# Patient Record
Sex: Female | Born: 1995 | Hispanic: No | Marital: Single | Smoking: Never smoker
Health system: Southern US, Community
[De-identification: ages and names within clinical notes are randomized; demographics above are authoritative.]

## PROBLEM LIST (undated history)

## (undated) DIAGNOSIS — J45909 Unspecified asthma, uncomplicated: Secondary | ICD-10-CM

---

## 2016-08-24 ENCOUNTER — Encounter (HOSPITAL_COMMUNITY): Payer: Self-pay | Admitting: Emergency Medicine

## 2016-08-24 ENCOUNTER — Ambulatory Visit (HOSPITAL_COMMUNITY)
Admission: EM | Admit: 2016-08-24 | Discharge: 2016-08-24 | Disposition: A | Discharge status: Home / Self Care | Payer: Self-pay | Attending: Family Medicine | Admitting: Family Medicine

## 2016-08-24 DIAGNOSIS — R0982 Postnasal drip: Secondary | ICD-10-CM

## 2016-08-24 DIAGNOSIS — J069 Acute upper respiratory infection, unspecified: Secondary | ICD-10-CM

## 2016-08-24 NOTE — ED Provider Notes (Signed)
CSN: [id number]     Arrival date & time 08/24/16  1559 History   First MD Initiated Contact with Patient 08/24/16 1615     Chief Complaint  Patient presents with  . URI   (Consider location/radiation/quality/duration/timing/severity/associated sxs/prior Treatment) 21 year old female who works at [employer] presents with a 2-3 day history of cough, sneezing, coughing up phlegm in her throat, pain in the throat with coughing and stuffy nose. Denies fever or chills. She has been taking Robitussin and using her albuterol inhaler for sneezing. She is currently afebrile and vital signs are normal. She is smiling, laughing and showing no signs of distress.      History reviewed. No pertinent past medical history. History reviewed. No pertinent surgical history. No family history on file. Social History  Substance Use Topics  . Smoking status: Never Smoker  . Smokeless tobacco: Never Used  . Alcohol use No   OB History    No data available     Review of Systems  Constitutional: Positive for activity change and fatigue. Negative for appetite change, chills and fever.  HENT: Positive for congestion, postnasal drip and rhinorrhea. Negative for facial swelling.   Eyes: Negative.   Respiratory: Positive for cough. Negative for shortness of breath.   Cardiovascular: Negative.   Musculoskeletal: Negative for neck pain and neck stiffness.  Skin: Negative for pallor and rash.  Neurological: Negative.   All other systems reviewed and are negative.   Allergies  Patient has no known allergies.  Home Medications   Prior to Admission medications   Medication Sig Start Date End Date Taking? Authorizing Provider  albuterol (PROVENTIL HFA;VENTOLIN HFA) 108 (90 Base) MCG/ACT inhaler Inhale into the lungs every 6 (six) hours as needed for wheezing or shortness of breath.   Yes Historical Provider, MD   Meds Ordered and Administered this Visit  Medications - No data to display  BP 129/91 (BP  Location: Left Arm)   Pulse 101   Temp 98.4 F (36.9 C) (Oral)   Resp 18   LMP 07/29/2016   SpO2 98%  No data found.   Physical Exam  Constitutional: She is oriented to person, place, and time. She appears well-developed and well-nourished. No distress.  HENT:  Head: Normocephalic and atraumatic.  Right Ear: External ear normal.  Left Ear: External ear normal.  Bilateral TMs are normal Oropharynx with minor erythema and clear PND. No exudates or swelling.  Eyes: EOM are normal. Pupils are equal, round, and reactive to light.  Neck: Normal range of motion. Neck supple.  Cardiovascular: Normal rate, regular rhythm, normal heart sounds and intact distal pulses.   Pulmonary/Chest: Effort normal and breath sounds normal. No respiratory distress. She has no wheezes. She has no rales.  Musculoskeletal: Normal range of motion. She exhibits no edema.  Neurological: She is alert and oriented to person, place, and time.  Skin: Skin is warm and dry.  Nursing note and vitals reviewed.   Urgent Care Course     Procedures (including critical care time)  Labs Review Labs Reviewed - No data to display  Imaging Review No results found.   Visual Acuity Review  Right Eye Distance:   Left Eye Distance:   Bilateral Distance:    Right Eye Near:   Left Eye Near:    Bilateral Near:         MDM   1. Acute upper respiratory infection   2. PND (post-nasal drip)    Sudafed PE 10 mg every 4 to  6 hours as needed for congestion Allegra or Zyrtec daily as needed for drainage and runny nose. For stronger antihistamine may take Chlor-Trimeton 2 to 4 mg every 4 to 6 hours, may cause drowsiness. Saline nasal spray used frequently. Ibuprofen 600 mg every 6 hours as needed for pain, discomfort or fever. Drink plenty of fluids and stay well-hydrated.     Hayden Rasmussen, NP 08/24/16 [phone]

## 2016-08-24 NOTE — ED Triage Notes (Signed)
Pt c/o cold sx onset: 2 days  Sx include: prod cough, sneezing, ST  Denies: fevers  Taking: OTC cold meds w/temp relief.   A&O x4... NAD

## 2016-08-24 NOTE — Discharge Instructions (Signed)
Sudafed PE 10 mg every 4 to 6 hours as needed for congestion °Allegra or Zyrtec daily as needed for drainage and runny nose. °For stronger antihistamine may take Chlor-Trimeton 2 to 4 mg every 4 to 6 hours, may cause drowsiness. °Saline nasal spray used frequently. °Ibuprofen 600 mg every 6 hours as needed for pain, discomfort or fever. °Drink plenty of fluids and stay well-hydrated. °

## 2016-10-20 ENCOUNTER — Encounter (HOSPITAL_COMMUNITY): Payer: Self-pay | Admitting: Emergency Medicine

## 2016-10-20 ENCOUNTER — Ambulatory Visit (HOSPITAL_COMMUNITY)
Admission: EM | Admit: 2016-10-20 | Discharge: 2016-10-20 | Disposition: A | Discharge status: Home / Self Care | Payer: Self-pay | Attending: Internal Medicine | Admitting: Internal Medicine

## 2016-10-20 DIAGNOSIS — B9789 Other viral agents as the cause of diseases classified elsewhere: Secondary | ICD-10-CM

## 2016-10-20 DIAGNOSIS — J069 Acute upper respiratory infection, unspecified: Secondary | ICD-10-CM

## 2016-10-20 HISTORY — DX: Unspecified asthma, uncomplicated: J45.909

## 2016-10-20 MED ORDER — IPRATROPIUM BROMIDE 0.06 % NA SOLN: 2.0000 | Freq: Four times a day (QID) | NASAL | 0 refills | Status: DC

## 2016-10-20 MED ORDER — BENZONATATE 100 MG PO CAPS: 100.0000 mg | ORAL_CAPSULE | Freq: Three times a day (TID) | ORAL | 0 refills | Status: DC

## 2016-10-20 NOTE — ED Provider Notes (Signed)
CSN: [id number]     Arrival date & time 10/20/16  1816 History   First MD Initiated Contact with Patient 10/20/16 1958     Chief Complaint  Patient presents with  . URI   (Consider location/radiation/quality/duration/timing/severity/associated sxs/prior Treatment) Patient c/o uri sx's for 2 days.   The history is provided by the patient.  URI  Presenting symptoms: congestion, cough and fatigue   Severity:  Mild Onset quality:  Sudden Duration:  2 days Timing:  Constant Progression:  Worsening Chronicity:  New Relieved by:  None tried Worsened by:  Nothing Ineffective treatments:  None tried   Past Medical History:  Diagnosis Date  . Asthma    History reviewed. No pertinent surgical history. No family history on file. Social History  Substance Use Topics  . Smoking status: Never Smoker  . Smokeless tobacco: Never Used  . Alcohol use No   OB History    No data available     Review of Systems  Constitutional: Positive for fatigue.  HENT: Positive for congestion.   Eyes: Negative.   Respiratory: Positive for cough.   Cardiovascular: Negative.   Gastrointestinal: Negative.   Endocrine: Negative.   Genitourinary: Negative.   Musculoskeletal: Negative.   Allergic/Immunologic: Negative.   Neurological: Negative.   Hematological: Negative.   Psychiatric/Behavioral: Negative.     Allergies  Patient has no known allergies.  Home Medications   Prior to Admission medications   Medication Sig Start Date End Date Taking? Authorizing Provider  albuterol (PROVENTIL HFA;VENTOLIN HFA) 108 (90 Base) MCG/ACT inhaler Inhale into the lungs every 6 (six) hours as needed for wheezing or shortness of breath.   Yes Historical Provider, MD  cetirizine (ZYRTEC) 10 MG tablet Take 10 mg by mouth daily.   Yes Historical Provider, MD  benzonatate (TESSALON) 100 MG capsule Take 1 capsule (100 mg total) by mouth every 8 (eight) hours. 10/20/16   Deatra Canter, FNP  ipratropium  (ATROVENT) 0.06 % nasal spray Place 2 sprays into both nostrils 4 (four) times daily. 10/20/16   Deatra Canter, FNP   Meds Ordered and Administered this Visit  Medications - No data to display  BP 128/83   Pulse (!) 101   Temp 98.5 F (36.9 C) (Oral)   Resp 16   Ht 5\' 4"  (1.626 m)   LMP 09/29/2016   SpO2 100%  No data found.   Physical Exam  Constitutional: She appears well-developed and well-nourished.  HENT:  Head: Normocephalic and atraumatic.  Right Ear: External ear normal.  Left Ear: External ear normal.  Mouth/Throat: Oropharynx is clear and moist.  Eyes: Conjunctivae and EOM are normal. Pupils are equal, round, and reactive to light.  Neck: Normal range of motion. Neck supple.  Cardiovascular: Normal rate, regular rhythm and normal heart sounds.   Pulmonary/Chest: Effort normal and breath sounds normal.  Abdominal: Soft. Bowel sounds are normal.  Nursing note and vitals reviewed.   Urgent Care Course     Procedures (including critical care time)  Labs Review Labs Reviewed - No data to display  Imaging Review No results found.   Visual Acuity Review  Right Eye Distance:   Left Eye Distance:   Bilateral Distance:    Right Eye Near:   Left Eye Near:    Bilateral Near:         MDM   1. Viral URI with cough    Atrovent nasal spray Tessalon Perles  Push po fluids, rest, tylenol and motrin otc prn  as directed for fever, arthralgias, and myalgias.  Follow up prn if sx's continue or persist.   Deatra CanterWilliam J Eligh Rybacki, FNP 10/20/16 2000

## 2016-10-20 NOTE — ED Triage Notes (Signed)
PT reports productive cough and congestion. PT denies fevers. His. Of asthma

## 2017-08-29 ENCOUNTER — Other Ambulatory Visit: Payer: Self-pay

## 2017-08-29 ENCOUNTER — Ambulatory Visit (HOSPITAL_COMMUNITY)
Admission: EM | Admit: 2017-08-29 | Discharge: 2017-08-29 | Disposition: A | Discharge status: Home / Self Care | Payer: Self-pay | Attending: Family Medicine | Admitting: Family Medicine

## 2017-08-29 ENCOUNTER — Encounter (HOSPITAL_COMMUNITY): Payer: Self-pay | Admitting: Emergency Medicine

## 2017-08-29 DIAGNOSIS — J111 Influenza due to unidentified influenza virus with other respiratory manifestations: Secondary | ICD-10-CM

## 2017-08-29 DIAGNOSIS — R062 Wheezing: Secondary | ICD-10-CM

## 2017-08-29 DIAGNOSIS — R69 Illness, unspecified: Principal | ICD-10-CM

## 2017-08-29 MED ORDER — PREDNISONE 10 MG (21) PO TBPK: ORAL_TABLET | Freq: Every day | ORAL | 0 refills | Status: DC

## 2017-08-29 NOTE — Discharge Instructions (Signed)

## 2017-08-29 NOTE — ED Triage Notes (Signed)
Pt reports productive cough, nasal congestion and drainage, sore throat and SOB since yesterday.

## 2017-09-03 ENCOUNTER — Other Ambulatory Visit: Payer: Self-pay

## 2017-09-03 ENCOUNTER — Ambulatory Visit (HOSPITAL_COMMUNITY)
Admission: EM | Admit: 2017-09-03 | Discharge: 2017-09-03 | Disposition: A | Discharge status: Home / Self Care | Payer: Self-pay | Attending: Family Medicine | Admitting: Family Medicine

## 2017-09-03 ENCOUNTER — Encounter (HOSPITAL_COMMUNITY): Payer: Self-pay | Admitting: Emergency Medicine

## 2017-09-03 DIAGNOSIS — J22 Unspecified acute lower respiratory infection: Secondary | ICD-10-CM

## 2017-09-03 MED ORDER — AZITHROMYCIN 250 MG PO TABS: ORAL_TABLET | ORAL | 0 refills | Status: DC

## 2017-09-03 NOTE — ED Triage Notes (Signed)
Seen 1/30.  Patient is coughing up more phlegm and increased drainage.  Patient is very certain she has been following instructions from 1/30 visit, cough will not go away

## 2017-09-03 NOTE — ED Provider Notes (Signed)
[facility]    CSN: [id number] Arrival date & time: 09/03/17  1007     History   Chief Complaint Chief Complaint  Patient presents with  . Cough    HPI Laurie Obrien is a 22 y.o. female.   Laurie Obrien presents with complaints of worsening cough. Symptoms started 1/29 with productive cough and wheezing, runny nose. She was seen on 1/30 and provided course of prednisone. States she had mildly improved but then over the past two days her cough has worsened. Productive of mucus. No known fevers. Still with runny nose. No sore throat or ear pain. Denies gi/gu complaints. Denies shortness of breath, chest pain. History of asthma, has used her inhaler which helps. Has not been taking zyrtec as she usually does. Denies wheezing.    ROS per HPI.       Past Medical History:  Diagnosis Date  . Asthma     There are no active problems to display for this patient.   History reviewed. No pertinent surgical history.  OB History    No data available       Home Medications    Prior to Admission medications   Medication Sig Start Date End Date Taking? Authorizing Provider  cetirizine (ZYRTEC) 10 MG tablet Take 10 mg by mouth daily.   Yes [provider]  predniSONE (STERAPRED UNI-PAK 21 TAB) 10 MG (21) TBPK tablet Take by mouth daily. Take as directed. 08/29/17  Yes Hagler, Arlys JohnBrian, MD  albuterol (PROVENTIL HFA;VENTOLIN HFA) 108 (90 Base) MCG/ACT inhaler Inhale into the lungs every 6 (six) hours as needed for wheezing or shortness of breath.    [provider]  azithromycin (ZITHROMAX) 250 MG tablet Take first 2 tablets together, then 1 every day until finished. 09/03/17   Georgetta HaberBurky, Taheerah Guldin B, NP    Family History Family History  Problem Relation Age of Onset  . Hypertension Maternal Grandmother     Social History Social History   Tobacco Use  . Smoking status: Never Smoker  . Smokeless tobacco: Never Used  Substance Use Topics  . Alcohol use: No  .  Drug use: No     Allergies   Patient has no known allergies.   Review of Systems Review of Systems   Physical Exam Triage Vital Signs ED Triage Vitals [09/03/17 1022]  Enc Vitals Group     BP 137/78     Pulse Rate (!) 105     Resp 18     Temp 98.4 F (36.9 C)     Temp Source Oral     SpO2 99 %     Weight      Height      Head Circumference      Peak Flow      Pain Score      Pain Loc      Pain Edu?      Excl. in GC?    No data found.  Updated Vital Signs BP 137/78 (BP Location: Right Arm)   Pulse (!) 105 Comment: Notified Tina G  Temp 98.4 F (36.9 C) (Oral)   Resp 18   LMP 08/22/2017 (Approximate)   SpO2 99%   Visual Acuity Right Eye Distance:   Left Eye Distance:   Bilateral Distance:    Right Eye Near:   Left Eye Near:    Bilateral Near:     Physical Exam  Constitutional: She is oriented to person, place, and time. She appears well-developed and well-nourished. No distress.  HENT:  Head: Normocephalic and atraumatic.  Right Ear: Tympanic membrane, external ear and ear canal normal.  Left Ear: Tympanic membrane, external ear and ear canal normal.  Nose: Rhinorrhea present. Right sinus exhibits no maxillary sinus tenderness and no frontal sinus tenderness. Left sinus exhibits no maxillary sinus tenderness and no frontal sinus tenderness.  Mouth/Throat: Uvula is midline, oropharynx is clear and moist and mucous membranes are normal. No tonsillar exudate.  Eyes: Conjunctivae and EOM are normal. Pupils are equal, round, and reactive to light.  Cardiovascular: Regular rhythm and normal heart sounds. Tachycardia present.  Pulmonary/Chest: Effort normal and breath sounds normal.  Neurological: She is alert and oriented to person, place, and time.  Skin: Skin is warm and dry.     UC Treatments / Results  Labs (all labs ordered are listed, but only abnormal results are displayed) Labs Reviewed - No data to display  EKG  EKG Interpretation None         Radiology No results found.  Procedures Procedures (including critical care time)  Medications Ordered in UC Medications - No data to display   Initial Impression / Assessment and Plan / UC Course  I have reviewed the triage vital signs and the nursing notes.  Pertinent labs & imaging results that were available during my care of the patient were reviewed by me and considered in my medical decision making (see chart for details).     Worsening symptoms over the past week, on prednisone. Noted tachycardia. History of asthma. zpack initiated, deferred imaging at this time. Continue with supportive cares, complete course of prednisone. Return precautions provided. Patient verbalized understanding and agreeable to plan.    Final Clinical Impressions(s) / UC Diagnoses   Final diagnoses:  Lower respiratory infection    ED Discharge Orders        Ordered    azithromycin (ZITHROMAX) 250 MG tablet     09/03/17 1038       Controlled Substance Prescriptions [place] Controlled Substance Registry consulted? Not Applicable   Georgetta Haber, NP 09/03/17 1043

## 2017-09-03 NOTE — Discharge Instructions (Signed)
Push fluids to ensure adequate hydration and keep secretions thin.  °Tylenol and/or ibuprofen as needed for pain or fevers.  °Complete course of antibiotics.  °If symptoms worsen or do not improve in the next week to return to be seen or to follow up with your PCP.   °

## 2017-09-03 NOTE — ED Provider Notes (Signed)
  [facility]   [id number] 08/29/17 Arrival Time: 1825  ASSESSMENT & PLAN:  1. Influenza-like illness   2. Wheezing     Meds ordered this encounter  Medications  . predniSONE (STERAPRED UNI-PAK 21 TAB) 10 MG (21) TBPK tablet    Sig: Take by mouth daily. Take as directed.    Dispense:  21 tablet    Refill:  0   Discussed typical duration of symptoms. OTC symptom care as needed. Ensure adequate fluid intake and rest. May f/u with PCP or here as needed.  Reviewed expectations re: course of current medical issues. Questions answered. Outlined signs and symptoms indicating need for more acute intervention. Patient verbalized understanding. After Visit Summary given.   SUBJECTIVE: History from: patient.  Laurie Obrien is a 22 y.o. female who presents with complaint of nasal congestion, post-nasal drainage, and a persistent dry cough. Onset abrupt, approximately 1 day ago. Overall fatigued with body aches. SOB: none. Wheezing: questions mild at times, esp with coughing. Fever: yes, subjective. Overall normal PO intake without n/v. Sick contacts: no. OTC treatment: None.  Received flu shot this year: no.  Social History   Tobacco Use  Smoking Status Never Smoker  Smokeless Tobacco Never Used    ROS: As per HPI.   OBJECTIVE:  Vitals:   08/29/17 1846  BP: 113/78  Pulse: (!) 108  Temp: 99 F (37.2 C)  TempSrc: Oral  SpO2: 99%     General appearance: alert; appears fatigued HEENT: nasal congestion; clear runny nose; throat irritation secondary to post-nasal drainage Neck: supple without LAD Lungs: unlabored respirations, symmetrical air entry with mild bilateral exp wheezes; cough: mild; no respiratory distress Skin: warm and dry Psychological: alert and cooperative; normal mood and affect  No Known Allergies  Past Medical History:  Diagnosis Date  . Asthma    History reviewed. No pertinent family history. Social History   Socioeconomic History   . Marital status: Single    Spouse name: Not on file  . Number of children: Not on file  . Years of education: Not on file  . Highest education level: Not on file  Social Needs  . Financial resource strain: Not on file  . Food insecurity - worry: Not on file  . Food insecurity - inability: Not on file  . Transportation needs - medical: Not on file  . Transportation needs - non-medical: Not on file  Occupational History  . Not on file  Tobacco Use  . Smoking status: Never Smoker  . Smokeless tobacco: Never Used  Substance and Sexual Activity  . Alcohol use: No  . Drug use: No  . Sexual activity: No  Other Topics Concern  . Not on file  Social History Narrative  . Not on file           Mardella LaymanHagler, Brylee Mcgreal, MD 09/03/17 [phone]

## 2018-10-07 ENCOUNTER — Ambulatory Visit (HOSPITAL_COMMUNITY)
Admission: EM | Admit: 2018-10-07 | Discharge: 2018-10-07 | Disposition: A | Discharge status: Home / Self Care | Payer: Self-pay | Attending: Emergency Medicine | Admitting: Emergency Medicine

## 2018-10-07 ENCOUNTER — Encounter (HOSPITAL_COMMUNITY): Payer: Self-pay

## 2018-10-07 ENCOUNTER — Other Ambulatory Visit: Payer: Self-pay

## 2018-10-07 DIAGNOSIS — R059 Cough, unspecified: Secondary | ICD-10-CM

## 2018-10-07 DIAGNOSIS — J209 Acute bronchitis, unspecified: Secondary | ICD-10-CM

## 2018-10-07 DIAGNOSIS — R05 Cough: Secondary | ICD-10-CM

## 2018-10-07 MED ORDER — DEXTROMETHORPHAN HBR 15 MG/5ML PO SYRP: 10.0000 mL | ORAL_SOLUTION | Freq: Four times a day (QID) | ORAL | 0 refills | Status: DC | PRN

## 2018-10-07 MED ORDER — PREDNISONE 10 MG (21) PO TBPK: ORAL_TABLET | Freq: Every day | ORAL | 0 refills | Status: DC

## 2018-10-07 NOTE — ED Provider Notes (Signed)
[facility]    CSN: [id number] Arrival date & time: 10/07/18  0806     History   Chief Complaint Chief Complaint  Patient presents with  . Cough    HPI Laurie Obrien is a 23 y.o. female.   Pt states that she has had cough, congestion, ear pain. Not getting any better. She has a hx of asthma but does not feel that it is any better with taking inhaler. Has not taken anything pta       Past Medical History:  Diagnosis Date  . Asthma     There are no active problems to display for this patient.   History reviewed. No pertinent surgical history.  OB History   No obstetric history on file.      Home Medications    Prior to Admission medications   Medication Sig Start Date End Date Taking? Authorizing Provider  albuterol (PROVENTIL HFA;VENTOLIN HFA) 108 (90 Base) MCG/ACT inhaler Inhale into the lungs every 6 (six) hours as needed for wheezing or shortness of breath.    [provider]  azithromycin (ZITHROMAX) 250 MG tablet Take first 2 tablets together, then 1 every day until finished. 09/03/17   Georgetta Haber, NP  cetirizine (ZYRTEC) 10 MG tablet Take 10 mg by mouth daily.    [provider]  dextromethorphan 15 MG/5ML syrup Take 10 mLs (30 mg total) by mouth 4 (four) times daily as needed for cough. 10/07/18   Coralyn Mark, NP  predniSONE (STERAPRED UNI-PAK 21 TAB) 10 MG (21) TBPK tablet Take by mouth daily. Take as directed. 10/07/18   Coralyn Mark, NP    Family History Family History  Problem Relation Age of Onset  . Hypertension Maternal Grandmother     Social History Social History   Tobacco Use  . Smoking status: Never Smoker  . Smokeless tobacco: Never Used  Substance Use Topics  . Alcohol use: No  . Drug use: No     Allergies   Patient has no known allergies.   Review of Systems Review of Systems  Constitutional: Negative.   HENT: Positive for congestion, postnasal drip, rhinorrhea, sinus pain and  sneezing.   Respiratory: Positive for cough.   Cardiovascular: Negative.   Gastrointestinal: Negative.   Skin: Negative.   Neurological: Negative.      Physical Exam Triage Vital Signs ED Triage Vitals  Enc Vitals Group     BP 10/07/18 0827 129/89     Pulse Rate 10/07/18 0827 (!) 101     Resp 10/07/18 0827 16     Temp 10/07/18 0827 98.2 F (36.8 C)     Temp Source 10/07/18 0827 Oral     SpO2 10/07/18 0827 100 %     Weight 10/07/18 0826 180 lb (81.6 kg)     Height --      Head Circumference --      Peak Flow --      Pain Score 10/07/18 0826 5     Pain Loc --      Pain Edu? --      Excl. in GC? --    No data found.  Updated Vital Signs BP 129/89 (BP Location: Left Arm)   Pulse (!) 101   Temp 98.2 F (36.8 C) (Oral)   Resp 16   Wt 180 lb (81.6 kg)   LMP 09/16/2018   SpO2 100%   BMI 30.90 kg/m   Visual Acuity     Physical Exam HENT:  Head: Normocephalic.     Right Ear: Tympanic membrane normal.     Left Ear: Tympanic membrane normal.     Nose: Congestion present.     Mouth/Throat:     Mouth: Mucous membranes are moist.  Eyes:     Pupils: Pupils are equal, round, and reactive to light.  Cardiovascular:     Rate and Rhythm: Normal rate.  Pulmonary:     Effort: Pulmonary effort is normal.  Abdominal:     General: Abdomen is flat.  Musculoskeletal: Normal range of motion.  Skin:    General: Skin is warm.     Capillary Refill: Capillary refill takes less than 2 seconds.  Neurological:     Mental Status: She is alert.      UC Treatments / Results  Labs (all labs ordered are listed, but only abnormal results are displayed) Labs Reviewed - No data to display  EKG None  Radiology No results found.  Procedures Procedures (including critical care time)  Medications Ordered in UC Medications - No data to display  Initial Impression / Assessment and Plan / UC Course  I have reviewed the triage vital signs and the nursing notes.  Pertinent  labs & imaging results that were available during my care of the patient were reviewed by me and considered in my medical decision making (see chart for details).     Discussed that the cough may linger for a few weeks  Take meds as needed  Use a humidifier  Take a daily calartin or zyrtec    Final Clinical Impressions(s) / UC Diagnoses   Final diagnoses:  Cough  Acute bronchitis, unspecified organism     Discharge Instructions     Discussed that the cough may linger for a few weeks  Take meds as needed  Use a humidifier  Take a daily calartin or zyrtec     ED Prescriptions    Medication Sig Dispense Auth. Provider   predniSONE (STERAPRED UNI-PAK 21 TAB) 10 MG (21) TBPK tablet Take by mouth daily. Take as directed. 21 tablet Maple Mirza L, NP   dextromethorphan 15 MG/5ML syrup Take 10 mLs (30 mg total) by mouth 4 (four) times daily as needed for cough. 120 mL Coralyn Mark, NP     Controlled Substance Prescriptions [place] Controlled Substance Registry consulted? Not Applicable   Coralyn Mark, NP 10/07/18 1322

## 2018-10-07 NOTE — ED Triage Notes (Signed)
Pt cc cough this started last night. Pt states she has been sneezing as well.

## 2018-10-07 NOTE — Discharge Instructions (Addendum)
Discussed that the cough may linger for a few weeks  Take meds as needed  Use a humidifier  Take a daily calartin or zyrtec

## 2018-10-18 ENCOUNTER — Other Ambulatory Visit: Payer: Self-pay

## 2018-10-18 ENCOUNTER — Ambulatory Visit (HOSPITAL_COMMUNITY)
Admission: EM | Admit: 2018-10-18 | Discharge: 2018-10-18 | Disposition: A | Discharge status: Home / Self Care | Payer: Self-pay | Attending: Family Medicine | Admitting: Family Medicine

## 2018-10-18 DIAGNOSIS — S93492A Sprain of other ligament of left ankle, initial encounter: Secondary | ICD-10-CM

## 2018-10-18 NOTE — ED Provider Notes (Signed)
[facility]    CSN: [id number] Arrival date & time: 10/18/18  1644     History   Chief Complaint Chief Complaint  Patient presents with  . Foot Pain    HPI Laurie Obrien is a 23 y.o. female.   She is presenting with left ankle and foot pain.  Having pain on the lateral aspect of the midfoot.  Started this morning when she had an inversion injury.  The pain is moderate in severity.  Having mild swelling but no significant ecchymosis.  No significant history of similar symptoms.  HPI  Past Medical History:  Diagnosis Date  . Asthma     There are no active problems to display for this patient.   No past surgical history on file.  OB History   No obstetric history on file.      Home Medications    Prior to Admission medications   Medication Sig Start Date End Date Taking? Authorizing Provider  albuterol (PROVENTIL HFA;VENTOLIN HFA) 108 (90 Base) MCG/ACT inhaler Inhale into the lungs every 6 (six) hours as needed for wheezing or shortness of breath.    [provider]  azithromycin (ZITHROMAX) 250 MG tablet Take first 2 tablets together, then 1 every day until finished. 09/03/17   Georgetta Haber, NP  cetirizine (ZYRTEC) 10 MG tablet Take 10 mg by mouth daily.    [provider]  dextromethorphan 15 MG/5ML syrup Take 10 mLs (30 mg total) by mouth 4 (four) times daily as needed for cough. 10/07/18   Coralyn Mark, NP  predniSONE (STERAPRED UNI-PAK 21 TAB) 10 MG (21) TBPK tablet Take by mouth daily. Take as directed. 10/07/18   Coralyn Mark, NP    Family History Family History  Problem Relation Age of Onset  . Hypertension Maternal Grandmother     Social History Social History   Tobacco Use  . Smoking status: Never Smoker  . Smokeless tobacco: Never Used  Substance Use Topics  . Alcohol use: No  . Drug use: No     Allergies   Patient has no known allergies.   Review of Systems Review of Systems  Constitutional:  Negative for fever.  HENT: Negative for congestion.   Respiratory: Negative for cough.   Cardiovascular: Negative for chest pain.  Gastrointestinal: Negative for abdominal pain.  Musculoskeletal: Positive for gait problem.  Skin: Negative for color change.  Neurological: Negative for weakness.  Hematological: Negative for adenopathy.  Psychiatric/Behavioral: Negative for agitation.     Physical Exam Triage Vital Signs ED Triage Vitals  Enc Vitals Group     BP 10/18/18 1725 118/85     Pulse Rate 10/18/18 1725 98     Resp 10/18/18 1725 16     Temp 10/18/18 1725 (!) 97.4 F (36.3 C)     Temp Source 10/18/18 1725 Temporal     SpO2 10/18/18 1725 99 %     Weight 10/18/18 1724 180 lb (81.6 kg)     Height 10/18/18 1724 5\' 4"  (1.626 m)     Head Circumference --      Peak Flow --      Pain Score 10/18/18 1723 5     Pain Loc --      Pain Edu? --      Excl. in GC? --    No data found.  Updated Vital Signs BP 118/85 (BP Location: Left Arm)   Pulse 98   Temp (!) 97.4 F (36.3 C) (Temporal)  Resp 16   Ht 5\' 4"  (1.626 m)   Wt 81.6 kg   LMP 10/12/2018 (Exact Date)   SpO2 99%   BMI 30.90 kg/m   Visual Acuity Right Eye Distance:   Left Eye Distance:   Bilateral Distance:    Right Eye Near:   Left Eye Near:    Bilateral Near:     Physical Exam Gen: NAD, alert, cooperative with exam, well-appearing ENT: normal lips, normal nasal mucosa,  Eye: normal EOM, normal conjunctiva and lids CV:  no edema, +2 pedal pulses   Resp: no accessory muscle use, non-labored,  GI: no masses or tenderness, no hernia  Skin: no rashes, no areas of induration  Neuro: normal tone, normal sensation to touch Psych:  normal insight, alert and oriented MSK:  Left ankle/foot:  Mild swelling of the lateral foot.  TTP over the cuboid and base of the 5th MT  Normal ROm  Negative anterior drawer  Some pain with walking  Neurovascularly intact     UC Treatments / Results  Labs (all labs  ordered are listed, but only abnormal results are displayed) Labs Reviewed - No data to display  EKG None  Radiology No results found.  Procedures Procedures (including critical care time)  Medications Ordered in UC Medications - No data to display  Initial Impression / Assessment and Plan / UC Course  I have reviewed the triage vital signs and the nursing notes.  Pertinent labs & imaging results that were available during my care of the patient were reviewed by me and considered in my medical decision making (see chart for details).     Laurie Obrien is a 23 year old female is presenting with left ankle pain.  Most likely she has an ankle sprain.  No significant ecchymosis on exam today.  Having some pain with ambulation.  Placed in a postop shoe.  Provided a work note.  Given indications to follow-up in 1 week if no improvement in order to obtain imaging.  Counseled on home exercise therapy and supportive care.  Given indications to follow-up.  Final Clinical Impressions(s) / UC Diagnoses   Final diagnoses:  Sprain of anterior talofibular ligament of left ankle, initial encounter     Discharge Instructions     Please try the range of motion exercises  Please take tylenol for pain  Please try ice for swelling  Please follow up in one week if the pain continues for an xray.     ED Prescriptions    None     Controlled Substance Prescriptions [place] Controlled Substance Registry consulted? Not Applicable   Myra Rude, MD 10/18/18 [phone]

## 2018-10-18 NOTE — ED Notes (Signed)
Patient verbalizes understanding of discharge instructions. Opportunity for questioning and answers were provided. Patient discharged from [facility] by RN.  

## 2018-10-18 NOTE — Discharge Instructions (Signed)
Please try the range of motion exercises  Please take tylenol for pain  Please try ice for swelling  Please follow up in one week if the pain continues for an xray.

## 2018-10-18 NOTE — ED Triage Notes (Signed)
Per pt she fell today and fell on the pavement. She rolled her ankle and landed on her ankle. No obvious deformity but some swelling

## 2018-10-23 ENCOUNTER — Encounter (HOSPITAL_COMMUNITY): Payer: Self-pay | Admitting: Emergency Medicine

## 2018-10-23 ENCOUNTER — Other Ambulatory Visit: Payer: Self-pay

## 2018-10-23 ENCOUNTER — Ambulatory Visit (INDEPENDENT_AMBULATORY_CARE_PROVIDER_SITE_OTHER): Payer: Self-pay

## 2018-10-23 ENCOUNTER — Ambulatory Visit (HOSPITAL_COMMUNITY)
Admission: EM | Admit: 2018-10-23 | Discharge: 2018-10-23 | Disposition: A | Discharge status: Home / Self Care | Payer: Self-pay | Attending: Family Medicine | Admitting: Family Medicine

## 2018-10-23 DIAGNOSIS — S93492A Sprain of other ligament of left ankle, initial encounter: Secondary | ICD-10-CM

## 2018-10-23 DIAGNOSIS — M25572 Pain in left ankle and joints of left foot: Secondary | ICD-10-CM

## 2018-10-23 NOTE — ED Triage Notes (Signed)
Pt here for follow up of a left sprained ankle.  Pt states it is a little better but the Tylenol is not helping with her pain.

## 2018-10-23 NOTE — Discharge Instructions (Signed)
You may use over the counter ibuprofen or acetaminophen as needed.  ° °

## 2018-10-23 NOTE — ED Provider Notes (Signed)
[facility]   [id number] 10/23/18 Arrival Time: 1305  ASSESSMENT & PLAN:  1. Sprain of anterior talofibular ligament of left ankle, initial encounter    I have personally viewed the imaging studies ordered this visit. No fractures seen.  Orders Placed This Encounter  Procedures  . DG Ankle Complete Left  . Apply ASO ankle   Rest the injured area as much as practical.  Natural history and expected course discussed. Questions answered. Rest, ice, compression, elevation (RICE) therapy. Fit with ankle brace for use over next 1 week. Encouraged WBAT.  Ibuprofen TID with food.  Reviewed expectations re: course of current medical issues. Questions answered. Outlined signs and symptoms indicating need for more acute intervention. Patient verbalized understanding. After Visit Summary given.  SUBJECTIVE: History from: patient. Laurie Obrien is a 23 y.o. female who was seen 5 days ago and diagnosed with a L ankle sprain. Inversion injury. A little better but continued lateral ankle pain and difficulty with bearing weight. Still using crutches. Mild swelling. No new injuries. Aggravating factors: movement and weight bearing. Alleviating factors: rest. Associated symptoms: none reported. Extremity sensation changes or weakness: none. Self treatment: Tylenol with mild relief History of similar: no.  History reviewed. No pertinent surgical history.   ROS: As per HPI. All other systems negative.    OBJECTIVE:  Vitals:   10/23/18 1348  BP: 127/84  Pulse: (!) 103  Resp: 12  Temp: 99.1 F (37.3 C)  TempSrc: Oral  SpO2: 97%    General appearance: alert; no distress Extremities: . LLE: warm and well perfused; fairly well localized moderate tenderness over left lateral ankle over ATFL distribution; without gross deformities; with mild swelling; with no bruising; ROM: limited by reported pain CV: brisk extremity capillary refill of LLE; 2+ DP and PT pulse of RLE.  Skin: warm and dry; no visible rashes Neurologic: using crutches to ambulate; normal reflexes of RLE and LLE; normal sensation of RLE and LLE; normal strength of RLE and LLE Psychological: alert and cooperative; normal mood and affect  Imaging: Dg Ankle Complete Left  Result Date: 10/23/2018 CLINICAL DATA:  Pain following injury EXAM: LEFT ANKLE COMPLETE - 3+ VIEW COMPARISON:  None. FINDINGS: Frontal, oblique, and lateral views obtained. There is no appreciable fracture or joint effusion. There is no appreciable joint space narrowing or erosion. Ankle mortise appears intact. IMPRESSION: No fracture or arthropathy.  Ankle mortise appears intact. Electronically Signed   By: Bretta Bang III M.D.   On: 10/23/2018 14:14    No Known Allergies  Past Medical History:  Diagnosis Date  . Asthma    Social History   Socioeconomic History  . Marital status: Single    Spouse name: Not on file  . Number of children: Not on file  . Years of education: Not on file  . Highest education level: Not on file  Occupational History  . Not on file  Social Needs  . Financial resource strain: Not on file  . Food insecurity:    Worry: Not on file    Inability: Not on file  . Transportation needs:    Medical: Not on file    Non-medical: Not on file  Tobacco Use  . Smoking status: Never Smoker  . Smokeless tobacco: Never Used  Substance and Sexual Activity  . Alcohol use: No  . Drug use: No  . Sexual activity: Never    Birth control/protection: None  Lifestyle  . Physical activity:    Days per week: Not  on file    Minutes per session: Not on file  . Stress: Not on file  Relationships  . Social connections:    Talks on phone: Not on file    Gets together: Not on file    Attends religious service: Not on file    Active member of club or organization: Not on file    Attends meetings of clubs or organizations: Not on file    Relationship status: Not on file  Other Topics Concern  . Not  on file  Social History Narrative  . Not on file   Family History  Problem Relation Age of Onset  . Hypertension Maternal Grandmother    History reviewed. No pertinent surgical history.    Mardella Layman, MD 10/23/18 [phone]

## 2018-10-28 NOTE — Progress Notes (Deleted)
Patient ID: Laurie Obrien, female   DOB: 1996/05/07, 23 y.o.   MRN: [id number]  After being seen for a strain of the ATF of her L ankle.

## 2018-10-30 ENCOUNTER — Ambulatory Visit: Payer: Self-pay | Attending: Family Medicine | Admitting: Physician Assistant

## 2018-10-30 ENCOUNTER — Inpatient Hospital Stay: Payer: Self-pay

## 2018-10-30 VITALS — BP 131/85 | HR 96 | Temp 98.4°F | Resp 16 | Ht 63.5 in

## 2018-10-30 DIAGNOSIS — M79672 Pain in left foot: Secondary | ICD-10-CM

## 2018-10-30 MED ORDER — IBUPROFEN 800 MG PO TABS: 800.0000 mg | ORAL_TABLET | Freq: Three times a day (TID) | ORAL | 0 refills | Status: DC | PRN

## 2018-10-30 NOTE — Progress Notes (Signed)
Patient ID: Danyal Monclova, female   DOB: 1996-06-20, 23 y.o.   MRN: [id number]       Remeigh Ditsworth, is a 23 y.o. female  BMW:[id number]  UVO:[id number]  DOB - 1996/01/06  Subjective:  Chief Complaint and HPI: Kameryn Hertzler is a 23 y.o. female here today to establish care and for a follow up visit After being seen in the ED 10/23/2018 for a strain of the L ATF.  She continues to have pain and is unable to bear weight.  She is having pain in the L foot mid-lateral aspect/5th metatarsal. OTC advil not helping.  ED/Hospital notes reviewed.  Ankle x-rays are negative  ROS:   Constitutional:  No f/c, No night sweats, No unexplained weight loss. EENT:  No vision changes, No blurry vision, No hearing changes. No mouth, throat, or ear problems.  Respiratory: No cough, No SOB Cardiac: No CP, no palpitations GI:  No abd pain, No N/V/D. GU: No Urinary s/sx Musculoskeletal: +L foot pain Neuro: No headache, no dizziness, no motor weakness.  Skin: No rash Endocrine:  No polydipsia. No polyuria.  Psych: Denies SI/HI  No problems updated.  ALLERGIES: No Known Allergies  PAST MEDICAL HISTORY: Past Medical History:  Diagnosis Date  . Asthma     MEDICATIONS AT HOME: Prior to Admission medications   Medication Sig Start Date End Date Taking? Authorizing Provider  albuterol (PROVENTIL HFA;VENTOLIN HFA) 108 (90 Base) MCG/ACT inhaler Inhale into the lungs every 6 (six) hours as needed for wheezing or shortness of breath.   Yes [provider]  acetaminophen (TYLENOL) 500 MG tablet Take 1,000 mg by mouth every 6 (six) hours as needed.    [provider]  azithromycin (ZITHROMAX) 250 MG tablet Take first 2 tablets together, then 1 every day until finished. Patient not taking: Reported on 10/30/2018 09/03/17   Linus Mako B, NP  cetirizine (ZYRTEC) 10 MG tablet Take 10 mg by mouth daily.    [provider]  dextromethorphan 15 MG/5ML syrup Take 10 mLs (30 mg total) by  mouth 4 (four) times daily as needed for cough. Patient not taking: Reported on 10/30/2018 10/07/18   Coralyn Mark, NP  ibuprofen (ADVIL,MOTRIN) 800 MG tablet Take 1 tablet (800 mg total) by mouth every 8 (eight) hours as needed. 10/30/18   Anders Simmonds, PA-C  predniSONE (STERAPRED UNI-PAK 21 TAB) 10 MG (21) TBPK tablet Take by mouth daily. Take as directed. Patient not taking: Reported on 10/30/2018 10/07/18   Coralyn Mark, NP     Objective:  EXAM:   Vitals:   10/30/18 1032  BP: 131/85  Pulse: 96  Resp: 16  Temp: 98.4 F (36.9 C)  TempSrc: Oral  SpO2: 100%  Height: 5' 3.5" (1.613 m)    General appearance : A&OX3. NAD. Non-toxic-appearing, using crutches, not weight bearing Chest/Lungs:  Breathing-non-labored, Good air entry bilaterally, breath sounds normal without rales, rhonchi, or wheezing  CVS: S1 S2 regular, no murmurs, gallops, rubs  L foot-bruising distal foot.  No swelling, ankle and ATF non-TTP.  L mid 5th metatarsal TTP.  Normal dorsi-flexion and plantar flexion of foot.  Pulses=B.   Neurology:  CN II-XII grossly intact, Non focal.   Psych:  TP linear. J/I WNL. Normal speech. Appropriate eye contact and affect.  Skin:  No Rash  Data Review No results found for: HGBA1C   Assessment & Plan   1. Left foot pain Continue sweedeo and post op shoe.   - DG Foot Complete  Left; Future - Ambulatory referral to Orthopedic Surgery - ibuprofen (ADVIL,MOTRIN) 800 MG tablet; Take 1 tablet (800 mg total) by mouth every 8 (eight) hours as needed.  Dispense: 30 tablet; Refill: 0   Patient have been counseled extensively about nutrition and exercise  Return in about 3 months (around 01/29/2019) for assign PCP.  The patient was given clear instructions to go to ER or return to medical center if symptoms don't improve, worsen or new problems develop. The patient verbalized understanding. The patient was told to call to get lab results if they haven't heard anything in the  next week.     Georgian Co, PA-C [facility] and [facility] [place], [place] [phone]   10/30/2018, 10:50 AM

## 2018-10-31 ENCOUNTER — Encounter (INDEPENDENT_AMBULATORY_CARE_PROVIDER_SITE_OTHER): Payer: Self-pay | Admitting: Orthopedic Surgery

## 2018-10-31 ENCOUNTER — Other Ambulatory Visit: Payer: Self-pay

## 2018-10-31 ENCOUNTER — Ambulatory Visit (INDEPENDENT_AMBULATORY_CARE_PROVIDER_SITE_OTHER): Payer: Self-pay | Admitting: Orthopedic Surgery

## 2018-10-31 ENCOUNTER — Ambulatory Visit (INDEPENDENT_AMBULATORY_CARE_PROVIDER_SITE_OTHER): Payer: Self-pay

## 2018-10-31 DIAGNOSIS — M79672 Pain in left foot: Secondary | ICD-10-CM

## 2018-10-31 NOTE — Progress Notes (Signed)
   Office Visit Note   Patient: Laurie Obrien           Date of Birth: 26-Jan-1996           MRN: [id number] Visit Date: 10/31/2018 Requested by: Anders Simmonds, PA-C [id number] [place], [place] [address] PCP: Patient, No Pcp Per  Subjective: Chief Complaint  Patient presents with  . Left Ankle - Pain  . Left Foot - Pain    HPI: Laurie Obrien is a 23 year old patient who sustained an inversion injury to her left foot 2 weeks ago.  Radiographs from the hospital of the ankle were negative for fracture.  She has been in an ASO but reports continued pain.  She does report some bruising which is resolved.              ROS: All systems reviewed are negative as they relate to the chief complaint within the history of present illness.  Patient denies  fevers or chills.   Assessment & Plan: Visit Diagnoses:  1. Pain in left foot     Plan: Impression is inversion injury left foot with no discrete fracture around the fifth metatarsal.  She did have some bruising ecchymosis.  The peroneal tendon attachment is palpable and intact.  Plan for continued mobilization.  I would consider MRI scanning in about 4 weeks if she is not improved.  I do not think she needs the ASO as this does not appear to be an ankle stability problem.  Follow-Up Instructions: No follow-ups on file.   Orders:  Orders Placed This Encounter  Procedures  . XR Foot Complete Left   No orders of the defined types were placed in this encounter.     Procedures: No procedures performed   Clinical Data: No additional findings.  Objective: Vital Signs: LMP 10/12/2018 (Exact Date)   Physical Exam:   Constitutional: Patient appears well-developed HEENT:  Head: Normocephalic Eyes:EOM are normal Neck: Normal range of motion Cardiovascular: Normal rate Pulmonary/chest: Effort normal Neurologic: Patient is alert Skin: Skin is warm Psychiatric: Patient has normal mood and affect    Ortho Exam: Ortho exam  demonstrates full active and passive range of motion of the ankle.  No tenderness of the ATFL or CFL.  No medial sided tenderness.  Pedal pulses intact.  Does have some tenderness at the base of the fifth metatarsal with some bruising and ecchymosis in this area but the peroneal brevis tendon is palpable and intact.  Specialty Comments:  No specialty comments available.  Imaging: Xr Foot Complete Left  Result Date: 10/31/2018 AP lateral oblique left foot reviewed.  No fracture dislocation is present.  Specifically no injury to the fifth metatarsal is present.    PMFS History: There are no active problems to display for this patient.  Past Medical History:  Diagnosis Date  . Asthma     Family History  Problem Relation Age of Onset  . Hypertension Maternal Grandmother     History reviewed. No pertinent surgical history. Social History   Occupational History  . Not on file  Tobacco Use  . Smoking status: Never Smoker  . Smokeless tobacco: Never Used  Substance and Sexual Activity  . Alcohol use: No  . Drug use: No  . Sexual activity: Never    Birth control/protection: None

## 2019-01-30 ENCOUNTER — Ambulatory Visit: Payer: Self-pay | Attending: Family Medicine | Admitting: Family Medicine

## 2019-01-30 ENCOUNTER — Other Ambulatory Visit: Payer: Self-pay

## 2019-01-30 DIAGNOSIS — J4520 Mild intermittent asthma, uncomplicated: Secondary | ICD-10-CM

## 2019-01-30 DIAGNOSIS — J3089 Other allergic rhinitis: Secondary | ICD-10-CM

## 2019-01-30 DIAGNOSIS — J45909 Unspecified asthma, uncomplicated: Secondary | ICD-10-CM | POA: Insufficient documentation

## 2019-01-30 NOTE — Progress Notes (Signed)
Patient has been called and DOB has been verified. Patient has been screened and transferred to PCP to start phone visit.    Patient has no concerns today.

## 2019-01-30 NOTE — Progress Notes (Signed)
Virtual Visit via Telephone Note  I connected with Laurie Obrien, on 01/30/2019 at 8:39 AM by telephone due to the COVID-19 pandemic and verified that I am speaking with the correct person using two identifiers.   Consent: I discussed the limitations, risks, security and privacy concerns of performing an evaluation and management service by telephone and the availability of in person appointments. I also discussed with the patient that there may be a patient responsible charge related to this service. The patient expressed understanding and agreed to proceed.   Location of Patient: Home  Location of Provider: Clinic   Persons participating in Telemedicine visit: Faria Casella Farrington-CMA Dr. Felecia Shelling     History of Present Illness: 23 year old female with a history of asthma, seasonal allergies who presents today to establish care with me.  Her asthma is controlled and she only needs her Proventil inhaler a couple of days a week and is currently not on any controller medications. She is on Zyrtec for allergies and this is controlled. With regards to healthcare maintenance she has never had a Pap smear. She has no additional concerns today.   Past Medical History:  Diagnosis Date  . Asthma    No Known Allergies  Current Outpatient Medications on File Prior to Visit  Medication Sig Dispense Refill  . albuterol (PROVENTIL HFA;VENTOLIN HFA) 108 (90 Base) MCG/ACT inhaler Inhale into the lungs every 6 (six) hours as needed for wheezing or shortness of breath.    . cetirizine (ZYRTEC) 10 MG tablet Take 10 mg by mouth daily.    Marland Kitchen acetaminophen (TYLENOL) 500 MG tablet Take 1,000 mg by mouth every 6 (six) hours as needed.    Marland Kitchen azithromycin (ZITHROMAX) 250 MG tablet Take first 2 tablets together, then 1 every day until finished. (Patient not taking: Reported on 10/30/2018) 6 tablet 0  . ibuprofen (ADVIL,MOTRIN) 800 MG tablet Take 1 tablet (800 mg total) by mouth every 8  (eight) hours as needed. (Patient not taking: Reported on 01/30/2019) 30 tablet 0   No current facility-administered medications on file prior to visit.     Observations/Objective: Awake, alert, oriented x3 Not in acute distress  Assessment and Plan: 1. Allergic rhinitis due to other allergic trigger, unspecified seasonality Stable Currently on Zyrtec  2. Mild intermittent asthma without complication Controlled Continue Proventil MDI as needed  Follow Up Instructions: 1 month for complete physical exam   I discussed the assessment and treatment plan with the patient. The patient was provided an opportunity to ask questions and all were answered. The patient agreed with the plan and demonstrated an understanding of the instructions.   The patient was advised to call back or seek an in-person evaluation if the symptoms worsen or if the condition fails to improve as anticipated.     I provided 10 minutes total of non-face-to-face time during this encounter including median intraservice time, reviewing previous notes, labs, imaging, medications, management and patient verbalized understanding.     Charlott Rakes, MD, FAAFP. [facility] and [facility], [place]   01/30/2019, 8:39 AM

## 2019-04-16 ENCOUNTER — Encounter: Payer: Self-pay | Admitting: *Deleted

## 2019-04-16 ENCOUNTER — Ambulatory Visit: Payer: Self-pay

## 2019-04-16 ENCOUNTER — Ambulatory Visit: Payer: Self-pay | Attending: Family Medicine | Admitting: Physician Assistant

## 2019-04-16 ENCOUNTER — Other Ambulatory Visit: Payer: Self-pay

## 2019-04-16 DIAGNOSIS — R5383 Other fatigue: Secondary | ICD-10-CM

## 2019-04-16 NOTE — Progress Notes (Signed)
Patient ID: Laurie Obrien, female   DOB: 1996-07-27, 23 y.o.   MRN: [id number] Virtual Visit via Telephone Note  I connected with Laurie Obrien on 04/16/19 at  2:10 PM EDT by telephone and verified that I am speaking with the correct person using two identifiers.   I discussed the limitations, risks, security and privacy concerns of performing an evaluation and management service by telephone and the availability of in person appointments. I also discussed with the patient that there may be a patient responsible charge related to this service. The patient expressed understanding and agreed to proceed.  Patient location: home My Location:  [place] office Persons on the call:  Me and the patient  History of Present Illness: 2 months history of extreme fatigue.  Doing the same job for 5 years.  No new or different activities.  Denies other s/sx.  No ST.  No fever.  No cough.  No N/V/D.  Periods WNL and regular.     Observations/Objective:  NAD.  A&Ox3   Assessment and Plan: 1. Fatigue, unspecified type Self-care, adequate nutrition, water, exercise.   OOW note for today - Comprehensive metabolic panel - TSH - Vitamin D, 25-hydroxy  Follow Up Instructions: See PCP prn   I discussed the assessment and treatment plan with the patient. The patient was provided an opportunity to ask questions and all were answered. The patient agreed with the plan and demonstrated an understanding of the instructions.   The patient was advised to call back or seek an in-person evaluation if the symptoms worsen or if the condition fails to improve as anticipated.  I provided 11 minutes of non-face-to-face time during this encounter.   Freeman Caldron, PA-C

## 2019-04-17 ENCOUNTER — Other Ambulatory Visit: Payer: Self-pay | Admitting: Physician Assistant

## 2019-04-17 LAB — VITAMIN D 25 HYDROXY (VIT D DEFICIENCY, FRACTURES): Vit D, 25-Hydroxy: 5.8 ng/mL — ABNORMAL LOW (ref 30.0–100.0)

## 2019-04-17 LAB — COMPREHENSIVE METABOLIC PANEL
ALT: 9 IU/L (ref 0–32)
AST: 15 IU/L (ref 0–40)
Albumin/Globulin Ratio: 1.5 (ref 1.2–2.2)
Albumin: 4.2 g/dL (ref 3.9–5.0)
Alkaline Phosphatase: 90 IU/L (ref 39–117)
BUN/Creatinine Ratio: 8 — ABNORMAL LOW (ref 9–23)
BUN: 5 mg/dL — ABNORMAL LOW (ref 6–20)
Bilirubin Total: <0.2 mg/dL (ref 0.0–1.2)
CO2: 24 mmol/L (ref 20–29)
Calcium: 9.4 mg/dL (ref 8.7–10.2)
Chloride: 104 mmol/L (ref 96–106)
Creatinine, Ser: 0.66 mg/dL (ref 0.57–1.00)
GFR calc Af Amer: 145 mL/min/{1.73_m2} (ref >59)
GFR calc non Af Amer: 126 mL/min/{1.73_m2} (ref >59)
Globulin, Total: 2.8 g/dL (ref 1.5–4.5)
Glucose: 107 mg/dL — ABNORMAL HIGH (ref 65–99)
Potassium: 4.1 mmol/L (ref 3.5–5.2)
Sodium: 142 mmol/L (ref 134–144)
Total Protein: 7 g/dL (ref 6.0–8.5)

## 2019-04-17 LAB — TSH: TSH: 1.25 u[IU]/mL (ref 0.450–4.500)

## 2019-04-17 MED ORDER — VITAMIN D (ERGOCALCIFEROL) 1.25 MG (50000 UNIT) PO CAPS: 50000.0000 [IU] | ORAL_CAPSULE | ORAL | 0 refills | Status: DC

## 2019-07-19 ENCOUNTER — Other Ambulatory Visit: Payer: Self-pay | Admitting: Physician Assistant

## 2019-07-27 ENCOUNTER — Other Ambulatory Visit: Payer: Self-pay

## 2019-07-27 ENCOUNTER — Ambulatory Visit (HOSPITAL_COMMUNITY)
Admission: EM | Admit: 2019-07-27 | Discharge: 2019-07-27 | Disposition: A | Discharge status: Home / Self Care | Payer: HRSA Program | Attending: Family Medicine | Admitting: Family Medicine

## 2019-07-27 ENCOUNTER — Encounter (HOSPITAL_COMMUNITY): Payer: Self-pay

## 2019-07-27 DIAGNOSIS — Z79899 Other long term (current) drug therapy: Secondary | ICD-10-CM | POA: Insufficient documentation

## 2019-07-27 DIAGNOSIS — Z20828 Contact with and (suspected) exposure to other viral communicable diseases: Secondary | ICD-10-CM | POA: Insufficient documentation

## 2019-07-27 DIAGNOSIS — R05 Cough: Secondary | ICD-10-CM | POA: Diagnosis present

## 2019-07-27 DIAGNOSIS — R062 Wheezing: Secondary | ICD-10-CM | POA: Diagnosis not present

## 2019-07-27 DIAGNOSIS — R059 Cough, unspecified: Secondary | ICD-10-CM

## 2019-07-27 MED ORDER — ALBUTEROL SULFATE HFA 108 (90 BASE) MCG/ACT IN AERS: 1.0000 | INHALATION_SPRAY | Freq: Four times a day (QID) | RESPIRATORY_TRACT | 0 refills | Status: DC | PRN

## 2019-07-27 MED ORDER — BENZONATATE 100 MG PO CAPS
100.0000 mg | ORAL_CAPSULE | Freq: Three times a day (TID) | ORAL | 0 refills | Status: DC
Start: 2019-07-27 — End: 2019-11-07

## 2019-07-27 NOTE — Discharge Instructions (Signed)
Tessalon Perles 1-2 every 8 hours for cough. Refilled albuterol inhaler We will call you with any abnormal results of your Covid swab.

## 2019-07-27 NOTE — ED Provider Notes (Signed)
[facility]    CSN: [id number] Arrival date & time: 07/27/19  1128      History   Chief Complaint Chief Complaint  Patient presents with  . Cough    HPI Laurie Obrien is a 23 y.o. female.   Patient is a 23 year old female presents today with cough, wheezing.  Symptoms have been constant, waxing waning over the past week.  She has been using her albuterol inhaler with relief.  She is also been using over-the-counter Tussin with some relief.  Denies any associated chest pain or shortness of breath.  Patient requesting Covid testing.  No known sick contacts or recent traveling.  No fever, chills, body aches or night sweats.  ROS per HPI      Past Medical History:  Diagnosis Date  . Asthma     Patient Active Problem List   Diagnosis Date Noted  . Asthma     History reviewed. No pertinent surgical history.  OB History   No obstetric history on file.      Home Medications    Prior to Admission medications   Medication Sig Start Date End Date Taking? Authorizing Provider  acetaminophen (TYLENOL) 500 MG tablet Take 1,000 mg by mouth every 6 (six) hours as needed.    [provider]  albuterol (VENTOLIN HFA) 108 (90 Base) MCG/ACT inhaler Inhale 1-2 puffs into the lungs every 6 (six) hours as needed for wheezing or shortness of breath. 07/27/19   Loura Halt A, NP  benzonatate (TESSALON) 100 MG capsule Take 1 capsule (100 mg total) by mouth every 8 (eight) hours. 07/27/19   Loura Halt A, NP  cetirizine (ZYRTEC) 10 MG tablet Take 10 mg by mouth daily.    [provider]  ibuprofen (ADVIL,MOTRIN) 800 MG tablet Take 1 tablet (800 mg total) by mouth every 8 (eight) hours as needed. 10/30/18   Argentina Donovan, PA-C  Vitamin D, Ergocalciferol, (DRISDOL) 1.25 MG (50000 UT) CAPS capsule TAKE 1 CAPSULE (50,000 UNITS TOTAL) BY MOUTH EVERY 7 (SEVEN) DAYS. 07/21/19   Charlott Rakes, MD    Family History Family History  Problem Relation Age of  Onset  . Asthma Mother   . Hypertension Maternal Grandmother   . Healthy Father     Social History Social History   Tobacco Use  . Smoking status: Never Smoker  . Smokeless tobacco: Never Used  Substance Use Topics  . Alcohol use: No  . Drug use: No     Allergies   Patient has no known allergies.   Review of Systems Review of Systems   Physical Exam Triage Vital Signs ED Triage Vitals  Enc Vitals Group     BP 07/27/19 1208 126/86     Pulse Rate 07/27/19 1208 99     Resp 07/27/19 1208 19     Temp 07/27/19 1208 99.1 F (37.3 C)     Temp Source 07/27/19 1208 Oral     SpO2 07/27/19 1208 100 %     Weight --      Height --      Head Circumference --      Peak Flow --      Pain Score 07/27/19 1205 0     Pain Loc --      Pain Edu? --      Excl. in [place]? --    No data found.  Updated Vital Signs BP 126/86 (BP Location: Left Arm)   Pulse 99   Temp 99.1 F (37.3  C) (Oral)   Resp 19   LMP 07/10/2019   SpO2 100%   Visual Acuity Right Eye Distance:   Left Eye Distance:   Bilateral Distance:    Right Eye Near:   Left Eye Near:    Bilateral Near:     Physical Exam Vitals and nursing note reviewed.  Constitutional:      General: She is not in acute distress.    Appearance: Normal appearance. She is not ill-appearing, toxic-appearing or diaphoretic.  HENT:     Head: Normocephalic.     Nose: Nose normal.     Mouth/Throat:     Pharynx: Oropharynx is clear.  Eyes:     Conjunctiva/sclera: Conjunctivae normal.  Cardiovascular:     Rate and Rhythm: Normal rate and regular rhythm.  Pulmonary:     Effort: Pulmonary effort is normal.     Breath sounds: Normal breath sounds.  Musculoskeletal:        General: Normal range of motion.     Cervical back: Normal range of motion.  Skin:    General: Skin is warm and dry.     Findings: No rash.  Neurological:     Mental Status: She is alert.  Psychiatric:        Mood and Affect: Mood normal.      UC  Treatments / Results  Labs (all labs ordered are listed, but only abnormal results are displayed) Labs Reviewed  NOVEL CORONAVIRUS, NAA (HOSP ORDER, SEND-OUT TO REF LAB; TAT 18-24 HRS)    EKG   Radiology No results found.  Procedures Procedures (including critical care time)  Medications Ordered in UC Medications - No data to display  Initial Impression / Assessment and Plan / UC Course  I have reviewed the triage vital signs and the nursing notes.  Pertinent labs & imaging results that were available during my care of the patient were reviewed by me and considered in my medical decision making (see chart for details).     Cough-lungs clear on exam, most likely viral  Tessalon Perles for cough as needed Refilled albuterol inhaler as requested Covid swab sent for testing with labs pending.  Precautions given.   Final Clinical Impressions(s) / UC Diagnoses   Final diagnoses:  Cough     Discharge Instructions     Tessalon Perles 1-2 every 8 hours for cough. Refilled albuterol inhaler We will call you with any abnormal results of your Covid swab.    ED Prescriptions    Medication Sig Dispense Auth. Provider   benzonatate (TESSALON) 100 MG capsule Take 1 capsule (100 mg total) by mouth every 8 (eight) hours. 21 capsule Jamier Urbas A, NP   albuterol (VENTOLIN HFA) 108 (90 Base) MCG/ACT inhaler Inhale 1-2 puffs into the lungs every 6 (six) hours as needed for wheezing or shortness of breath. 18 g Dahlia Byes A, NP     PDMP not reviewed this encounter.   Janace Aris, NP 07/27/19 [phone]

## 2019-07-27 NOTE — ED Triage Notes (Signed)
Pt. states for a week now she has had a nonstop asthmatic cough. Wants COVID testing.

## 2019-07-28 LAB — NOVEL CORONAVIRUS, NAA (HOSP ORDER, SEND-OUT TO REF LAB; TAT 18-24 HRS): SARS-CoV-2, NAA: NOT DETECTED

## 2019-11-07 ENCOUNTER — Other Ambulatory Visit: Payer: Self-pay

## 2019-11-07 ENCOUNTER — Ambulatory Visit (INDEPENDENT_AMBULATORY_CARE_PROVIDER_SITE_OTHER): Payer: Self-pay

## 2019-11-07 ENCOUNTER — Ambulatory Visit (HOSPITAL_COMMUNITY)
Admission: EM | Admit: 2019-11-07 | Discharge: 2019-11-07 | Disposition: A | Discharge status: Home / Self Care | Payer: Self-pay | Attending: Emergency Medicine | Admitting: Emergency Medicine

## 2019-11-07 ENCOUNTER — Encounter (HOSPITAL_COMMUNITY): Payer: Self-pay

## 2019-11-07 DIAGNOSIS — J45909 Unspecified asthma, uncomplicated: Secondary | ICD-10-CM | POA: Insufficient documentation

## 2019-11-07 DIAGNOSIS — J189 Pneumonia, unspecified organism: Secondary | ICD-10-CM | POA: Insufficient documentation

## 2019-11-07 DIAGNOSIS — Z20822 Contact with and (suspected) exposure to covid-19: Secondary | ICD-10-CM | POA: Insufficient documentation

## 2019-11-07 DIAGNOSIS — Z79899 Other long term (current) drug therapy: Secondary | ICD-10-CM | POA: Insufficient documentation

## 2019-11-07 DIAGNOSIS — R000 Tachycardia, unspecified: Secondary | ICD-10-CM | POA: Insufficient documentation

## 2019-11-07 DIAGNOSIS — R05 Cough: Secondary | ICD-10-CM

## 2019-11-07 MED ORDER — AEROCHAMBER PLUS MISC: 2 refills | Status: DC

## 2019-11-07 MED ORDER — HYDROCOD POLST-CPM POLST ER 10-8 MG/5ML PO SUER: 5.0000 mL | Freq: Two times a day (BID) | ORAL | 0 refills | Status: DC | PRN

## 2019-11-07 MED ORDER — AMOXICILLIN 500 MG PO TABS: 1000.0000 mg | ORAL_TABLET | Freq: Three times a day (TID) | ORAL | 0 refills | Status: AC

## 2019-11-07 MED ORDER — BENZONATATE 200 MG PO CAPS: 200.0000 mg | ORAL_CAPSULE | Freq: Three times a day (TID) | ORAL | 0 refills | Status: DC | PRN

## 2019-11-07 MED ORDER — ALBUTEROL SULFATE HFA 108 (90 BASE) MCG/ACT IN AERS: 2.0000 | INHALATION_SPRAY | RESPIRATORY_TRACT | 0 refills | Status: DC | PRN

## 2019-11-07 NOTE — Discharge Instructions (Addendum)
Your x-ray showed that you have a pneumonia in your left lower lung.  Finish the amoxicillin, even if you feel better.  2 puffs from your albuterol inhaler using your spacer every 4 hours for 2 days, then 2 puffs every 6 hours for 2 days, then as needed.  Tussionex for the cough at night, Tessalon for the cough during the day.  Follow-up with your primary care provider within the next week to make sure that you are getting better  Below is a list of primary care practices who are taking new patients for you to follow-up with.  [facility] internal medicine clinic Ground Floor - [facility], [address] [place], [place], [place] [address] [phone]  [facility] at [facility] [address] [place], [place] [address] [phone]  [facility] [address] [place], [place] [address] [phone]  [facility] [phone] [address] [place] [address]  [facility]: [address] [place] [address]  [phone]  [facility]: [address] [place] [address]   [phone]  [facility]: [address] [place] [address]  [phone]  [facility] : [address] [place] [address] [phone]  [facility]: [address] [place] [address] [phone]  [facility]: [address] [place] [address] [phone]  Dr. Oneal Grout [address] [facility] [place] [address]  [phone]  Dr. Jackie Plum, [facility]. [address] [place], [place] [address]  [phone]  Go to www.goodrx.com to look up your medications. This will give you a list of where you can find your prescriptions  at the most affordable prices. Or ask the pharmacist what the cash price is, or if they have any other discount programs available to help make your medication more affordable. This can be less expensive than what you would pay with insurance.

## 2019-11-07 NOTE — ED Triage Notes (Signed)
Pt c/o productive cough w/yellow phlegmx1 mo. Pt states she had the same in Jan and it has now came back. Pt denies fever, congestion or any other symptoms. Pt has no labored breathing. Skin color WNL. Lungs are clear.

## 2019-11-07 NOTE — ED Provider Notes (Signed)
HPI  SUBJECTIVE:  Laurie Obrien is a 24 y.o. female who presents with daily cough productive of yellowish phlegm for the past month.  She reports dull achy chest pain present with coughing only.  States the cough is worse at night and is unable to sleep because of it.  She denies fevers night sweats or unintentional weight loss.  No nasal congestion, sinus pain or pressure postnasal drip wheezing sore throat shortness of breath.  No GERD symptoms.  She has tried Psychiatrist, Tussin, tea and using her albuterol inhaler as needed.  The albuterol seems to help.  She does not have a spacer.  Symptoms are worse with lying down.  She denies any exercise intolerance.  No calf pain swelling hemoptysis surgery in the past 4 weeks, prolonged immobilization.  She has not used her inhaler nor did she take any decongestions prior to evaluation.  She has a past medical history of asthma, allergies.  Her allergies are not bothering her at this time.  No history of smoking diabetes hypertension cancer PE DVT exogenous estrogen, GERD.  LMP: 3/10.  Denies possibility being pregnant.  PMD: None.  Patient was seen here in December for cough and wheezing.  Thought to have viral cough, sent home with albuterol, Tessalon Perls.  Covid negative.  Past Medical History:  Diagnosis Date  . Asthma     History reviewed. No pertinent surgical history.  Family History  Problem Relation Age of Onset  . Asthma Mother   . Hypertension Maternal Grandmother   . Healthy Father     Social History   Tobacco Use  . Smoking status: Never Smoker  . Smokeless tobacco: Never Used  Substance Use Topics  . Alcohol use: No  . Drug use: No    No current facility-administered medications for this encounter.  Current Outpatient Medications:  .  acetaminophen (TYLENOL) 500 MG tablet, Take 1,000 mg by mouth every 6 (six) hours as needed., Disp: , Rfl:  .  albuterol (VENTOLIN HFA) 108 (90 Base) MCG/ACT inhaler, Inhale 2 puffs into the  lungs every 4 (four) hours as needed for wheezing or shortness of breath. Dispense with aerochamber, Disp: 18 g, Rfl: 0 .  amoxicillin (AMOXIL) 500 MG tablet, Take 2 tablets (1,000 mg total) by mouth 3 (three) times daily for 5 days., Disp: 30 tablet, Rfl: 0 .  benzonatate (TESSALON) 200 MG capsule, Take 1 capsule (200 mg total) by mouth 3 (three) times daily as needed for cough., Disp: 30 capsule, Rfl: 0 .  cetirizine (ZYRTEC) 10 MG tablet, Take 10 mg by mouth daily., Disp: , Rfl:  .  chlorpheniramine-HYDROcodone (TUSSIONEX PENNKINETIC ER) 10-8 MG/5ML SUER, Take 5 mLs by mouth every 12 (twelve) hours as needed for cough., Disp: 60 mL, Rfl: 0 .  Spacer/Aero-Holding Chambers (AEROCHAMBER PLUS) inhaler, Use as instructed, Disp: 1 each, Rfl: 2  No Known Allergies   ROS  As noted in HPI.   Physical Exam  BP 135/89   Pulse (!) 115   Temp 98.8 F (37.1 C) (Oral)   Resp 18   Ht 5\' 4"  (1.626 m)   Wt 85.3 kg   LMP 10/08/2019 (Exact Date)   SpO2 99%   BMI 32.27 kg/m   Constitutional: Well developed, well nourished, no acute distress Eyes:  EOMI, conjunctiva normal bilaterally HENT: Normocephalic, atraumatic,mucus membranes moist.  No nasal congestion no maxillary frontal sinus tenderness.  No postnasal drip. Respiratory: Normal inspiratory effort lungs clear bilaterally.  No anterior lateral chest wall tenderness  Cardiovascular: Regular tachycardia no murmurs rubs or gallops GI: nondistended skin: No rash, skin intact Musculoskeletal: Calves symmetric, nontender, no edema Neurologic: Alert & oriented x 3, no focal neuro deficits Psychiatric: Speech and behavior appropriate   ED Course   Medications - No data to display  Orders Placed This Encounter  Procedures  . SARS CORONAVIRUS 2 (TAT 6-24 HRS) Nasopharyngeal Nasopharyngeal Swab    Standing Status:   Standing    Number of Occurrences:   1    Order Specific Question:   Is this test for diagnosis or screening    Answer:    Screening    Order Specific Question:   Symptomatic for COVID-19 as defined by CDC    Answer:   No    Order Specific Question:   Hospitalized for COVID-19    Answer:   No    Order Specific Question:   Admitted to ICU for COVID-19    Answer:   No    Order Specific Question:   Previously tested for COVID-19    Answer:   Yes    Order Specific Question:   Resident in a congregate (group) care setting    Answer:   No    Order Specific Question:   Employed in healthcare setting    Answer:   No    Order Specific Question:   Pregnant    Answer:   No  . DG Chest 2 View    Standing Status:   Standing    Number of Occurrences:   1    Order Specific Question:   Reason for Exam (SYMPTOM  OR DIAGNOSIS REQUIRED)    Answer:   productive cough x 1 month    No results found for this or any previous visit (from the past 24 hour(s)). DG Chest 2 View  Result Date: 11/07/2019 CLINICAL DATA:  Productive cough for 1 month. EXAM: CHEST - 2 VIEW COMPARISON:  None. FINDINGS: The heart size is normal. Asymmetric left lower lobe airspace disease is present, best seen on the lateral view. Right lung is clear. No edema or effusion is present. Axial skeleton is normal. IMPRESSION: Asymmetric left lower lobe airspace disease concerning for pneumonia. Electronically Signed   By: Marin Roberts M.D.   On: 11/07/2019 09:10    ED Clinical Impression  1. Community acquired pneumonia of left lower lobe of lung      ED Assessment/Plan  Previous charts reviewed as noted in HPI.  We will check chest x-ray given duration of symptoms to rule out malignancy pneumonia.  Doubt CHF.  Could be poorly controlled asthma, bronchitis.  No evidence of sinusitis.  GERD in the differential.  Doubt PE even though she does not meet PERC criteria due to tachycardia.   [place] Narcotic database reviewed for this patient, and feel that the risk/benefit ratio today is favorable for proceeding with a prescription for controlled substance.   No opiate prescriptions in 2 years  Reviewed imaging independently.  Asymmetric left lower airspace disease concerning for pneumonia.  See radiology report for full details.  Presentation consistent with a left lower lobe pneumonia.  Will send home with amoxicillin 1 g 3 times daily for 5 days.  Also albuterol inhaler with a spacer, 2 puffs every 4 hours for the next 2 days, then 2 puffs every 6 hours for 2 days, then as needed.  Tessalon for the cough during the day, Tussionex for the cough at night.  will provide primary care list for ongoing  care.  To the ER if she gets worse  Discussed  imaging, MDM, treatment plan, and plan for follow-up with patient. Discussed sn/sx that should prompt return to the ED.  patient agrees with plan.   Meds ordered this encounter  Medications  . albuterol (VENTOLIN HFA) 108 (90 Base) MCG/ACT inhaler    Sig: Inhale 2 puffs into the lungs every 4 (four) hours as needed for wheezing or shortness of breath. Dispense with aerochamber    Dispense:  18 g    Refill:  0  . amoxicillin (AMOXIL) 500 MG tablet    Sig: Take 2 tablets (1,000 mg total) by mouth 3 (three) times daily for 5 days.    Dispense:  30 tablet    Refill:  0  . benzonatate (TESSALON) 200 MG capsule    Sig: Take 1 capsule (200 mg total) by mouth 3 (three) times daily as needed for cough.    Dispense:  30 capsule    Refill:  0  . chlorpheniramine-HYDROcodone (TUSSIONEX PENNKINETIC ER) 10-8 MG/5ML SUER    Sig: Take 5 mLs by mouth every 12 (twelve) hours as needed for cough.    Dispense:  60 mL    Refill:  0  . Spacer/Aero-Holding Chambers (AEROCHAMBER PLUS) inhaler    Sig: Use as instructed    Dispense:  1 each    Refill:  2    *This clinic note was created using Dragon dictation software. Therefore, there may be occasional mistakes despite careful proofreading.   ?    Melynda Ripple, MD 11/07/19 [phone]

## 2019-11-08 LAB — SARS CORONAVIRUS 2 (TAT 6-24 HRS): SARS Coronavirus 2: NEGATIVE

## 2019-11-17 ENCOUNTER — Ambulatory Visit (HOSPITAL_COMMUNITY)
Admission: EM | Admit: 2019-11-17 | Discharge: 2019-11-17 | Disposition: A | Discharge status: Home / Self Care | Payer: Self-pay | Attending: Family Medicine | Admitting: Family Medicine

## 2019-11-17 ENCOUNTER — Encounter (HOSPITAL_COMMUNITY): Payer: Self-pay | Admitting: Family Medicine

## 2019-11-17 DIAGNOSIS — J189 Pneumonia, unspecified organism: Secondary | ICD-10-CM

## 2019-11-17 MED ORDER — PREDNISONE 20 MG PO TABS: ORAL_TABLET | ORAL | 0 refills | Status: DC

## 2019-11-17 MED ORDER — AZITHROMYCIN 250 MG PO TABS: 250.0000 mg | ORAL_TABLET | Freq: Every day | ORAL | 0 refills | Status: DC

## 2019-11-17 NOTE — ED Triage Notes (Signed)
Pt recently treated for pneumonia, has completed antibiotic, here for continued productive cough

## 2019-11-17 NOTE — ED Provider Notes (Signed)
[facility]    CSN: [id number] Arrival date & time: 11/17/19  0940      History   Chief Complaint Chief Complaint  Patient presents with  . Cough    HPI Laurie Obrien is a 24 y.o. female.   Pt recently treated for pneumonia, has completed antibiotic, here for continued productive cough.  Had negative Covid test 10 days ago.  Patient was treated with amoxicillin product and cough suppressants and seemed to improve.  Nevertheless, the cough persists.  No fever, shortness of breath or vomiting.  Patient has a h/o asthma.  She is a Consulting civil engineer     Past Medical History:  Diagnosis Date  . Asthma     Patient Active Problem List   Diagnosis Date Noted  . Asthma     History reviewed. No pertinent surgical history.  OB History   No obstetric history on file.      Home Medications    Prior to Admission medications   Medication Sig Start Date End Date Taking? Authorizing Provider  acetaminophen (TYLENOL) 500 MG tablet Take 1,000 mg by mouth every 6 (six) hours as needed.    [provider]  albuterol (VENTOLIN HFA) 108 (90 Base) MCG/ACT inhaler Inhale 2 puffs into the lungs every 4 (four) hours as needed for wheezing or shortness of breath. Dispense with aerochamber 11/07/19   Domenick Gong, MD  azithromycin (ZITHROMAX) 250 MG tablet Take 1 tablet (250 mg total) by mouth daily. Take first 2 tablets together, then 1 every day until finished. 11/17/19   Elvina Sidle, MD  benzonatate (TESSALON) 200 MG capsule Take 1 capsule (200 mg total) by mouth 3 (three) times daily as needed for cough. 11/07/19   Domenick Gong, MD  cetirizine (ZYRTEC) 10 MG tablet Take 10 mg by mouth daily.    [provider]  chlorpheniramine-HYDROcodone (TUSSIONEX PENNKINETIC ER) 10-8 MG/5ML SUER Take 5 mLs by mouth every 12 (twelve) hours as needed for cough. 11/07/19   Domenick Gong, MD  predniSONE (DELTASONE) 20 MG tablet Two daily with food 11/17/19   Elvina Sidle, MD  Spacer/Aero-Holding Chambers (AEROCHAMBER PLUS) inhaler Use as instructed 11/07/19   Domenick Gong, MD    Family History Family History  Problem Relation Age of Onset  . Asthma Mother   . Hypertension Maternal Grandmother   . Healthy Father     Social History Social History   Tobacco Use  . Smoking status: Never Smoker  . Smokeless tobacco: Never Used  Substance Use Topics  . Alcohol use: No  . Drug use: No     Allergies   Patient has no known allergies.   Review of Systems Review of Systems  Constitutional: Negative.   Respiratory: Positive for cough.   All other systems reviewed and are negative.    Physical Exam Triage Vital Signs ED Triage Vitals [11/17/19 1045]  Enc Vitals Group     BP (!) 128/97     Pulse Rate (!) 112     Resp 18     Temp 98 F (36.7 C)     Temp src      SpO2 98 %     Weight      Height      Head Circumference      Peak Flow      Pain Score 0     Pain Loc      Pain Edu?      Excl. in [place]?    No data  found.  Updated Vital Signs BP (!) 128/97   Pulse (!) 112   Temp 98 F (36.7 C)   Resp 18   LMP 10/17/2019   SpO2 98%    Physical Exam Vitals and nursing note reviewed.  Constitutional:      General: She is not in acute distress.    Appearance: Normal appearance. She is obese. She is not ill-appearing or toxic-appearing.  HENT:     Head: Normocephalic.  Eyes:     Conjunctiva/sclera: Conjunctivae normal.  Cardiovascular:     Rate and Rhythm: Normal rate.  Pulmonary:     Effort: Pulmonary effort is normal.     Breath sounds: Rales present.     Comments: Wet rales LLL Musculoskeletal:        General: Normal range of motion.     Cervical back: Normal range of motion and neck supple.  Skin:    General: Skin is warm and dry.  Neurological:     General: No focal deficit present.     Mental Status: She is alert.  Psychiatric:        Mood and Affect: Mood normal.        Behavior: Behavior normal.         Thought Content: Thought content normal.        Judgment: Judgment normal.      UC Treatments / Results  Labs (all labs ordered are listed, but only abnormal results are displayed) Labs Reviewed - No data to display  EKG   Radiology No results found.  Procedures Procedures (including critical care time)  Medications Ordered in UC Medications - No data to display  Initial Impression / Assessment and Plan / UC Course  I have reviewed the triage vital signs and the nursing notes.  Pertinent labs & imaging results that were available during my care of the patient were reviewed by me and considered in my medical decision making (see chart for details).    Final Clinical Impressions(s) / UC Diagnoses   Final diagnoses:  Pneumonia of left lower lobe due to infectious organism     Discharge Instructions     Please return if symptoms do not clear by the end of the week    ED Prescriptions    Medication Sig Dispense Auth. Provider   predniSONE (DELTASONE) 20 MG tablet Two daily with food 10 tablet Robyn Haber, MD   azithromycin (ZITHROMAX) 250 MG tablet Take 1 tablet (250 mg total) by mouth daily. Take first 2 tablets together, then 1 every day until finished. 6 tablet Robyn Haber, MD     I have reviewed the PDMP during this encounter.   Robyn Haber, MD 11/17/19 1103

## 2019-11-17 NOTE — Discharge Instructions (Signed)
Please return if symptoms do not clear by the end of the week

## 2019-12-04 ENCOUNTER — Other Ambulatory Visit: Payer: Self-pay

## 2019-12-04 ENCOUNTER — Ambulatory Visit: Payer: Self-pay | Attending: Family | Admitting: Family

## 2019-12-04 DIAGNOSIS — J189 Pneumonia, unspecified organism: Secondary | ICD-10-CM

## 2019-12-04 NOTE — Progress Notes (Signed)
Virtual Visit via Telephone Note  I connected with Laurie Obrien, on 12/04/2019 at 3:17 PM by telephone due to the COVID-19 pandemic and verified that I am speaking with the correct person using two identifiers.  Due to current restrictions/limitations of in-office visits due to the COVID-19 pandemic, this scheduled clinical appointment was converted to a telehealth visit.   Consent: I discussed the limitations, risks, security and privacy concerns of performing an evaluation and management service by telephone and the availability of in person appointments. I also discussed with the patient that there may be a patient responsible charge related to this service. The patient expressed understanding and agreed to proceed.   Location of Patient: Home  Location of Provider: [facility] [place]  Persons participating in Telemedicine visit: Arlett Goold Durene Fruits, NP Orlan Leavens, CMA  History of Present Illness: Laurie Obrien is a 24 year old female with history of asthma who presents today for hospital follow-up.    1. PNEUMONIA FOLLOW-UP: Cough: yes, still having cough Cough productive or dry: productive with mucous, white, thin to thick consistency Fever: denies Shortness of breath: denies Chest pain: denies Nausea/vomiting: denies Diarrhea: denies Mental status changes: denies Heart palpitations: denies Did you complete Prednisone: yes Did you complete Azithromycin:  yes Are you still taking Albuterol with spacer: yes as needed Are you still taking Tessalon Perles: yes Are you still taking Tussionex: denies  Comments: Taking Mucinex over-the-counter 1 tablet twice per day which helps. Reports that she has not returned to the urgent care center as advised if symptoms did not clear by the end of the week at discharge on 11/17/2018. When asked why she did not return to urgent care as advised patient states that she feels that the doctors are giving her too  many antibiotics. She is concerned because she received 2 prescriptions for antibiotics within 2 weeks time. States she wants to allow her body to recover from taking so many medications in a short period of time. She states that the doctors are not listening to her because she requested a repeat x-ray on 11/17/2019 to make sure the pneumonia is getting better and states "the only thing they done was listen to my lungs and sent me home with prescriptions". She reports she does not want to return to urgent care because she feels they may just give her more medications stating the medications are helping but only helping a little.  On 11/07/2019 patient presented to the [facility] with daily cough productive of yellowish phlegm for the past month, dull achy chest pain present with coughing only, and cough worse at night causing trouble with sleep. Patient was seen by Dr. Alphonzo Cruise. During that encounter patient initially treated for left lower lobe pneumonia due to infectious organism with Amoxicillin, Albuterol inhaler with spacer, Tessalon for cough during day, and Tussionex for cough at night and seemed to improve. Covid test was negative 10 days ago.   However, cough persisted and patient returned to the [facility] on 11/17/2019 and was seen by Dr. Joseph Art. During that encounter patient was not short of breath, vomiting, or having fever. Patient is a Ship broker. During that encounter patient discharged home on Prednisone 20 mg tablet (Take two daily with food) and Azithromycin 250 mg tablet (Take 1 tablet by mouth daily). At discharge patient was instructed to return if symptoms did not clear by the end of the week.  Past Medical History:  Diagnosis Date  .  Asthma    No Known Allergies  Current Outpatient Medications on File Prior to Visit  Medication Sig Dispense Refill  . acetaminophen (TYLENOL) 500 MG tablet Take 1,000 mg by mouth every 6  (six) hours as needed.    Marland Kitchen albuterol (VENTOLIN HFA) 108 (90 Base) MCG/ACT inhaler Inhale 2 puffs into the lungs every 4 (four) hours as needed for wheezing or shortness of breath. Dispense with aerochamber 18 g 0  . azithromycin (ZITHROMAX) 250 MG tablet Take 1 tablet (250 mg total) by mouth daily. Take first 2 tablets together, then 1 every day until finished. 6 tablet 0  . benzonatate (TESSALON) 200 MG capsule Take 1 capsule (200 mg total) by mouth 3 (three) times daily as needed for cough. 30 capsule 0  . cetirizine (ZYRTEC) 10 MG tablet Take 10 mg by mouth daily.    . chlorpheniramine-HYDROcodone (TUSSIONEX PENNKINETIC ER) 10-8 MG/5ML SUER Take 5 mLs by mouth every 12 (twelve) hours as needed for cough. 60 mL 0  . predniSONE (DELTASONE) 20 MG tablet Two daily with food 10 tablet 0  . Spacer/Aero-Holding Chambers (AEROCHAMBER PLUS) inhaler Use as instructed 1 each 2   No current facility-administered medications on file prior to visit.    Observations/Objective: Alert and oriented x 3. Not in acute distress. Physical examination not completed as this is a telemedicine visit.  Assessment and Plan: 1. Pneumonia of left lower lobe due to infectious organism: -Patient report seems to be stable but still having a lingering productive cough of which she is concerned about. Patient denies shortness of breath, chest pain, and fever. Encouraged patient to return to the urgent care center as advised on 11/17/2019 during her discharge instructions. Patient is hesitant to return because she reports she does not want anymore medications but rather she would like a repeat chest x-ray of which her request was not granted during that visit. Patient denies needing refills on any current medications.  -Patient was given clear instructions to go to Emergency Department or return to medical center if symptoms don't improve, worsen, or new problems develop.The patient verbalized understanding.  Follow Up  Instructions: Patient was given clear instructions to go to Emergency Department or return to medical center if symptoms don't improve, worsen, or new problems develop.The patient verbalized understanding.  I discussed the assessment and treatment plan with the patient. The patient was provided an opportunity to ask questions and all were answered. The patient agreed with the plan and demonstrated an understanding of the instructions.   The patient was advised to call back or seek an in-person evaluation if the symptoms worsen or if the condition fails to improve as anticipated.  I provided 13 minutes total of non-face-to-face time during this encounter including median intraservice time, reviewing previous notes, labs, imaging, medications, management and patient verbalized understanding.   Rema Fendt, NP  [facility] and [facility] [place], [place] [phone]   12/04/2019, 7:57 AM

## 2019-12-05 NOTE — Patient Instructions (Signed)
Seek medical attention immediately if symptoms do not improve or worsen. Community-Acquired Pneumonia, Adult Pneumonia is an infection of the lungs. It causes swelling in the airways of the lungs. Mucus and fluid may also build up inside the airways. One type of pneumonia can happen while a person is in a hospital. A different type can happen when a person is not in a hospital (community-acquired pneumonia).  What are the causes?  This condition is caused by germs (viruses, bacteria, or fungi). Some types of germs can be passed from one person to another. This can happen when you breathe in droplets from the cough or sneeze of an infected person. What increases the risk? You are more likely to develop this condition if you:  Have a long-term (chronic) disease, such as: ? Chronic obstructive pulmonary disease (COPD). ? Asthma. ? Cystic fibrosis. ? Congestive heart failure. ? Diabetes. ? Kidney disease.  Have HIV.  Have sickle cell disease.  Have had your spleen removed.  Do not take good care of your teeth and mouth (poor dental hygiene).  Have a medical condition that increases the risk of breathing in droplets from your own mouth and nose.  Have a weakened body defense system (immune system).  Are a smoker.  Travel to areas where the germs that cause this illness are common.  Are around certain animals or the places they live. What are the signs or symptoms?  A dry cough.  A wet (productive) cough.  Fever.  Sweating.  Chest pain. This often happens when breathing deeply or coughing.  Fast breathing or trouble breathing.  Shortness of breath.  Shaking chills.  Feeling tired (fatigue).  Muscle aches. How is this treated? Treatment for this condition depends on many things. Most adults can be treated at home. In some cases, treatment must happen in a hospital. Treatment may include:  Medicines given by mouth or through an IV tube.  Being given extra  oxygen.  Respiratory therapy. In rare cases, treatment for very bad pneumonia may include:  Using a machine to help you breathe.  Having a procedure to remove fluid from around your lungs. Follow these instructions at home: Medicines  Take over-the-counter and prescription medicines only as told by your doctor. ? Only take cough medicine if you are losing sleep.  If you were prescribed an antibiotic medicine, take it as told by your doctor. Do not stop taking the antibiotic even if you start to feel better. General instructions   Sleep with your head and neck raised (elevated). You can do this by sleeping in a recliner or by putting a few pillows under your head.  Rest as needed. Get at least 8 hours of sleep each night.  Drink enough water to keep your pee (urine) pale yellow.  Eat a healthy diet that includes plenty of vegetables, fruits, whole grains, low-fat dairy products, and lean protein.  Do not use any products that contain nicotine or tobacco. These include cigarettes, e-cigarettes, and chewing tobacco. If you need help quitting, ask your doctor.  Keep all follow-up visits as told by your doctor. This is important. How is this prevented? A shot (vaccine) can help prevent pneumonia. Shots are often suggested for:  People older than 23 years of age.  People older than 23 years of age who: ? Are having cancer treatment. ? Have long-term (chronic) lung disease. ? Have problems with their body's defense system. You may also prevent pneumonia if you take these actions:  Get the  flu (influenza) shot every year.  Go to the dentist as often as told.  Wash your hands often. If you cannot use soap and water, use hand sanitizer. Contact a doctor if:  You have a fever.  You lose sleep because your cough medicine does not help. Get help right away if:  You are short of breath and it gets worse.  You have more chest pain.  Your sickness gets worse. This is very  serious if: ? You are an older adult. ? Your body's defense system is weak.  You cough up blood. Summary  Pneumonia is an infection of the lungs.  Most adults can be treated at home. Some will need treatment in a hospital.  Drink enough water to keep your pee pale yellow.  Get at least 8 hours of sleep each night. This information is not intended to replace advice given to you by your health care provider. Make sure you discuss any questions you have with your health care provider. Document Revised: 11/06/2018 Document Reviewed: 03/14/2018 Elsevier Patient Education  2020 [employer].

## 2020-07-13 ENCOUNTER — Other Ambulatory Visit: Payer: Self-pay

## 2020-07-13 ENCOUNTER — Encounter: Payer: Self-pay | Admitting: Family Medicine

## 2020-07-13 ENCOUNTER — Ambulatory Visit (INDEPENDENT_AMBULATORY_CARE_PROVIDER_SITE_OTHER): Payer: 59 | Admitting: Family Medicine

## 2020-07-13 VITALS — BP 120/80 | HR 97 | Wt 189.2 lb

## 2020-07-13 DIAGNOSIS — Z13220 Encounter for screening for lipoid disorders: Secondary | ICD-10-CM

## 2020-07-13 DIAGNOSIS — Z23 Encounter for immunization: Secondary | ICD-10-CM | POA: Diagnosis not present

## 2020-07-13 DIAGNOSIS — J4520 Mild intermittent asthma, uncomplicated: Secondary | ICD-10-CM | POA: Diagnosis not present

## 2020-07-13 NOTE — Assessment & Plan Note (Signed)
Well controlled on as needed albuterol 

## 2020-07-13 NOTE — Progress Notes (Signed)
    SUBJECTIVE:   CHIEF COMPLAINT / HPI:   New Patient visit - daughter of Areya Lemmerman  Overall feels well  ASTHMA - since childhood.  Never in hospital.  Uses as needed albuterol less than monthly.  No current symptoms   PERTINENT  PMH / PSH: Lives with her mother.  Full time student in finance at [employer]   OBJECTIVE:   BP 120/80   Pulse 97   Wt 189 lb 3.2 oz (85.8 kg)   LMP 06/19/2020   SpO2 99%   BMI 32.48 kg/m   Heart - Regular rate and rhythm.  No murmurs, gallops or rubs.    Lungs:  Normal respiratory effort, chest expands symmetrically. Lungs are clear to auscultation, no crackles or wheezes. Abdomen: soft and non-tender without masses, organomegaly or hernias noted.  No guarding or rebound Neck:  No deformities, thyromegaly, masses, or tenderness noted.   Supple with full range of motion without pain. Eye - Pupils Equal Round Reactive to light, Extraocular movements intact, Fundi without hemorrhage or visible lesions, Conjunctiva without redness or discharge Mobility:able to get up and down from exam table without assistance or distress Extremities:  No cyanosis, edema, or deformity noted with good range of motion of all major joints.     ASSESSMENT/PLAN:   Asthma Well controlled on as needed albuterol    HM - see after visit summary.  Main goals are for weight control and exercise. Will check lipids   Never sexually active - no PAP for now   Carney Living, MD [facility]

## 2020-07-13 NOTE — Patient Instructions (Signed)
Very nice to meet you  I would recommend  - You should exercise at least 20 minutes every day.    Choose something you like the most or hate the least.   Having a set time every day and having a partner will help you stick to it.    If you are too tired try to do at least 5 minutes, it often gets easier.  Continue healthy eating - plenty of fruits and veggies and less breads, pasta and fatty foods  I will call you if your tests are not good.  Otherwise, I will send you a message on MyChart (if it is active) or a letter in the mail..  If you do not hear from me with in 2 weeks please call our office.

## 2020-07-14 ENCOUNTER — Encounter: Payer: Self-pay | Admitting: Family Medicine

## 2020-07-14 LAB — LIPID PANEL
Chol/HDL Ratio: 3.5 ratio (ref 0.0–4.4)
Cholesterol, Total: 182 mg/dL (ref 100–199)
HDL: 52 mg/dL (ref >39)
LDL Chol Calc (NIH): 110 mg/dL — ABNORMAL HIGH (ref 0–99)
Triglycerides: 109 mg/dL (ref 0–149)
VLDL Cholesterol Cal: 20 mg/dL (ref 5–40)

## 2020-08-30 IMAGING — DX LEFT ANKLE COMPLETE - 3+ VIEW
3 series · 3 of 3 positions shown · non-contrast
Comparison: None.

CLINICAL DATA: Pain following injury

EXAM:
LEFT ANKLE COMPLETE - 3+ VIEW

[ankle ap]
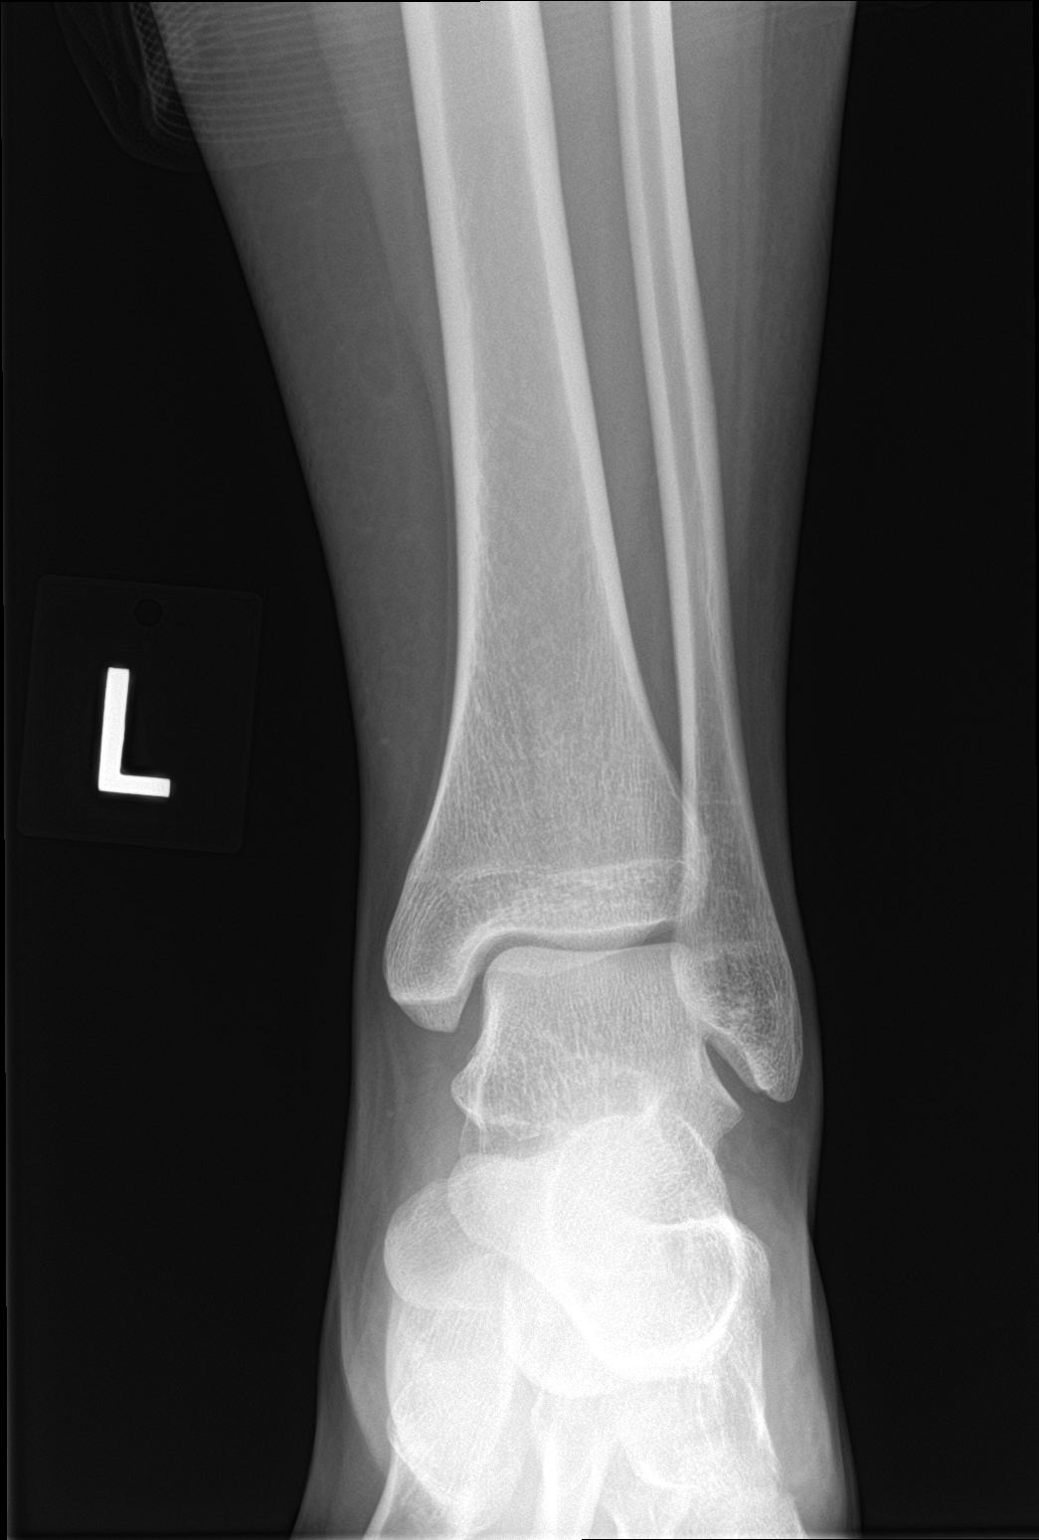

[ankle obl]
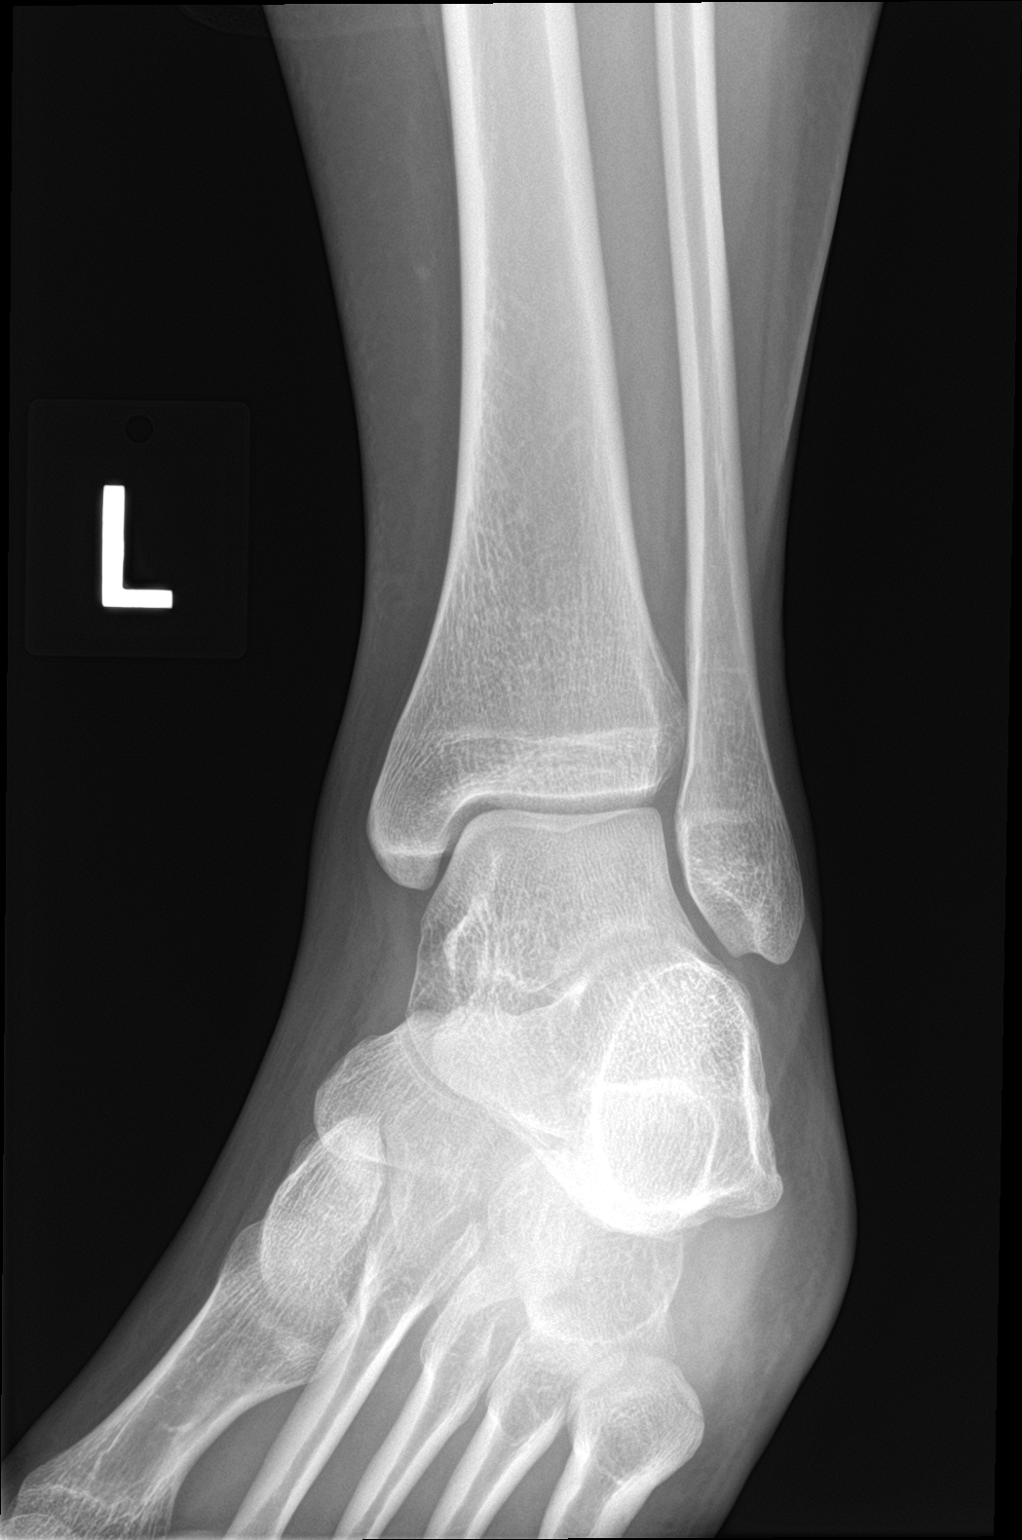

[ankle lat]
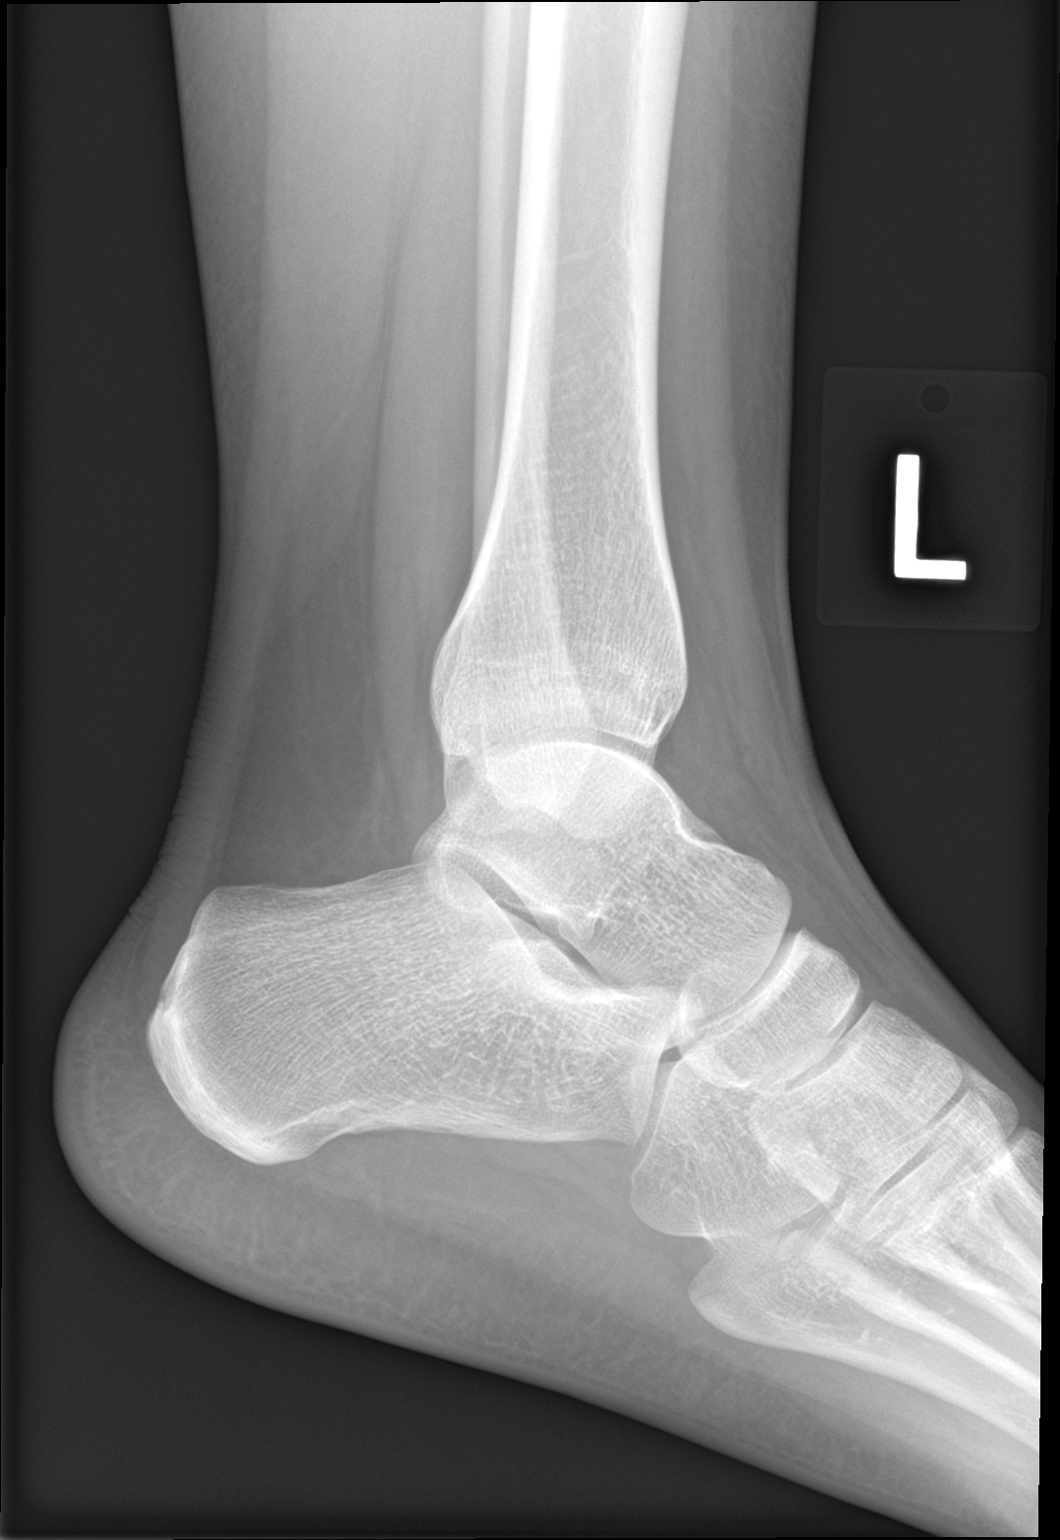

[3 of 3 positions shown; findings below may reference images not displayed]

FINDINGS: Frontal, oblique, and lateral views obtained. There is no
appreciable fracture or joint effusion. There is no appreciable
joint space narrowing or erosion. Ankle mortise appears intact.
IMPRESSION: No fracture or arthropathy.  Ankle mortise appears intact.

## 2020-10-30 ENCOUNTER — Other Ambulatory Visit: Payer: Self-pay

## 2020-10-30 ENCOUNTER — Encounter (HOSPITAL_COMMUNITY): Payer: Self-pay

## 2020-10-30 ENCOUNTER — Ambulatory Visit (HOSPITAL_COMMUNITY)
Admission: EM | Admit: 2020-10-30 | Discharge: 2020-10-30 | Disposition: A | Discharge status: Home / Self Care | Payer: 59 | Attending: Urgent Care | Admitting: Urgent Care

## 2020-10-30 DIAGNOSIS — J3089 Other allergic rhinitis: Secondary | ICD-10-CM

## 2020-10-30 DIAGNOSIS — J4530 Mild persistent asthma, uncomplicated: Secondary | ICD-10-CM | POA: Diagnosis not present

## 2020-10-30 MED ORDER — PSEUDOEPHEDRINE HCL 30 MG PO TABS: 30.0000 mg | ORAL_TABLET | Freq: Three times a day (TID) | ORAL | 0 refills | Status: DC | PRN

## 2020-10-30 MED ORDER — CETIRIZINE HCL 10 MG PO TABS: 10.0000 mg | ORAL_TABLET | Freq: Every day | ORAL | 0 refills | Status: DC

## 2020-10-30 MED ORDER — ALBUTEROL SULFATE HFA 108 (90 BASE) MCG/ACT IN AERS: 2.0000 | INHALATION_SPRAY | RESPIRATORY_TRACT | 0 refills | Status: DC | PRN

## 2020-10-30 MED ORDER — TRIAMCINOLONE ACETONIDE 55 MCG/ACT NA AERO: 2.0000 | INHALATION_SPRAY | Freq: Every day | NASAL | 0 refills | Status: DC

## 2020-10-30 MED ORDER — MONTELUKAST SODIUM 10 MG PO TABS: 10.0000 mg | ORAL_TABLET | Freq: Every day | ORAL | 0 refills | Status: DC

## 2020-10-30 NOTE — ED Triage Notes (Signed)
Pt c/o allergy flare up. Pt states she has been taking Zyrtec and the nasal spray with no relief. She states she felt sinus pressure yesterday and this morning. Pt states she needs a new inhaler.

## 2020-10-30 NOTE — ED Provider Notes (Signed)
[facility]   MRN: [id number] DOB: May 16, 1996  Subjective:   Laurie Obrien is a 25 y.o. female presenting for 2 day history of acute onset recurrent allergies. Has had significant sinus pressure in the mornings.  Has been using Zyrtec daily.  Also uses nonmedicated nasal sprays.  Has a history of asthma, has been using her inhaler once weekly but would like a refill.  Denies fever, sinus pain, ear pain, throat pain, cough, chest pain, shortness of breath.  No current facility-administered medications for this encounter.  Current Outpatient Medications:  .  cetirizine (ZYRTEC) 10 MG tablet, Take 10 mg by mouth daily., Disp: , Rfl:  .  albuterol (VENTOLIN HFA) 108 (90 Base) MCG/ACT inhaler, Inhale 2 puffs into the lungs every 4 (four) hours as needed for wheezing or shortness of breath. Dispense with aerochamber (Patient not taking: Reported on 07/13/2020), Disp: 18 g, Rfl: 0   No Known Allergies  Past Medical History:  Diagnosis Date  . Asthma      History reviewed. No pertinent surgical history.  Family History  Problem Relation Age of Onset  . Asthma Mother   . Hypertension Maternal Grandmother   . Healthy Father     Social History   Tobacco Use  . Smoking status: Never Smoker  . Smokeless tobacco: Never Used  Substance Use Topics  . Alcohol use: No  . Drug use: No    ROS   Objective:   Vitals: BP 121/90 (BP Location: Right Arm)   Pulse (!) 107   Temp 98.4 F (36.9 C) (Oral)   Resp 16   LMP 10/16/2020 (Exact Date)   SpO2 99%   Physical Exam Constitutional:      General: She is not in acute distress.    Appearance: Normal appearance. She is well-developed. She is not ill-appearing, toxic-appearing or diaphoretic.  HENT:     Head: Normocephalic and atraumatic.     Right Ear: External ear normal. No drainage or tenderness. No middle ear effusion. Tympanic membrane is not erythematous.     Left Ear: External ear normal. No drainage or  tenderness.  No middle ear effusion. Tympanic membrane is not erythematous.     Nose: Congestion present. No rhinorrhea.     Comments: Nasal turbinates boggy and edematous.    Mouth/Throat:     Mouth: Mucous membranes are moist. No oral lesions.     Pharynx: No pharyngeal swelling, oropharyngeal exudate, posterior oropharyngeal erythema or uvula swelling.     Tonsils: No tonsillar exudate or tonsillar abscesses.  Eyes:     General: No scleral icterus.       Right eye: No discharge.        Left eye: No discharge.     Extraocular Movements: Extraocular movements intact.     Right eye: Normal extraocular motion.     Left eye: Normal extraocular motion.     Conjunctiva/sclera: Conjunctivae normal.     Pupils: Pupils are equal, round, and reactive to light.  Cardiovascular:     Rate and Rhythm: Normal rate and regular rhythm.     Pulses: Normal pulses.     Heart sounds: Normal heart sounds. No murmur heard. No friction rub. No gallop.   Pulmonary:     Effort: Pulmonary effort is normal. No respiratory distress.     Breath sounds: Normal breath sounds. No stridor. No wheezing, rhonchi or rales.  Musculoskeletal:     Cervical back: Normal range of motion and neck supple.  Lymphadenopathy:     Cervical: No cervical adenopathy.  Skin:    General: Skin is warm and dry.     Findings: No rash.  Neurological:     General: No focal deficit present.     Mental Status: She is alert and oriented to person, place, and time.  Psychiatric:        Mood and Affect: Mood normal.        Behavior: Behavior normal.        Thought Content: Thought content normal.        Judgment: Judgment normal.       Assessment and Plan :   PDMP not reviewed this encounter.  1. Allergic rhinitis due to other allergic trigger, unspecified seasonality   2. Mild persistent asthma without complication     Patient's allergic rhinitis is mild and therefore we will have her start a nasal steroid, Singulair,  refilled her Zyrtec and albuterol inhaler.  Recommend supportive care otherwise.  Consider oral or injectable steroid course if symptoms persist or worsen.  Counseled patient on potential for adverse effects with medications prescribed/recommended today, ER and return-to-clinic precautions discussed, patient verbalized understanding.    Wallis Bamberg, PA-C 10/30/20 1058

## 2021-03-01 ENCOUNTER — Ambulatory Visit (HOSPITAL_COMMUNITY)
Admission: EM | Admit: 2021-03-01 | Discharge: 2021-03-01 | Disposition: A | Discharge status: Home / Self Care | Payer: 59 | Attending: Emergency Medicine | Admitting: Emergency Medicine

## 2021-03-01 ENCOUNTER — Encounter (HOSPITAL_COMMUNITY): Payer: Self-pay | Admitting: Emergency Medicine

## 2021-03-01 ENCOUNTER — Other Ambulatory Visit: Payer: Self-pay

## 2021-03-01 DIAGNOSIS — H6123 Impacted cerumen, bilateral: Secondary | ICD-10-CM

## 2021-03-01 NOTE — Discharge Instructions (Addendum)
Try Debrox to prevent wax buildup, and clean your ear with a thin cloth and your little finger in the future.

## 2021-03-01 NOTE — ED Provider Notes (Signed)
HPI  SUBJECTIVE:  Laurie Obrien is a 25 y.o. female who presents with decreased hearing starting 3 days ago after trying to clean her ears out with a Q-tip.  She reports pressure greater on her right than her left ear starting 2 days ago.  No otorrhea, ear pain, vertigo nasal congestion, URI or allergy symptoms.  No aggravating or alleviating factors.  She has not tried anything for this.  She has a past medical history of allergies on chronic medications, asthma.  LMP: Last week.  Denies the possibility of being pregnant.  PMD: Dr. Deirdre Priest   Past Medical History:  Diagnosis Date   Asthma     History reviewed. No pertinent surgical history.  Family History  Problem Relation Age of Onset   Asthma Mother    Hypertension Maternal Grandmother    Healthy Father     Social History   Tobacco Use   Smoking status: Never   Smokeless tobacco: Never  Vaping Use   Vaping Use: Never used  Substance Use Topics   Alcohol use: No   Drug use: No    No current facility-administered medications for this encounter.  Current Outpatient Medications:    cetirizine (ZYRTEC) 10 MG tablet, Take 1 tablet (10 mg total) by mouth daily., Disp: 90 tablet, Rfl: 0   montelukast (SINGULAIR) 10 MG tablet, Take 1 tablet (10 mg total) by mouth at bedtime., Disp: 90 tablet, Rfl: 0   albuterol (VENTOLIN HFA) 108 (90 Base) MCG/ACT inhaler, Inhale 2 puffs into the lungs every 4 (four) hours as needed for wheezing or shortness of breath. Dispense with aerochamber, Disp: 18 g, Rfl: 0  No Known Allergies   ROS  As noted in HPI.   Physical Exam  BP 120/84 (BP Location: Right Arm)   Pulse (!) 105   Temp 98.8 F (37.1 C) (Oral)   Resp 18   LMP 02/21/2021   SpO2 99%   Constitutional: Well developed, well nourished, no acute distress Eyes:  EOMI, conjunctiva normal bilaterally HENT:.  Decreased hearing right ear.  Cerumen impaction right ear.  Unable to visualize TM.  Partial cerumen impaction left  ear. Respiratory: Normal inspiratory effort Cardiovascular: Normal rate GI: nondistended skin: No rash, skin intact Musculoskeletal: no deformities Neurologic: Alert & oriented x 3, no focal neuro deficits Psychiatric: Speech and behavior appropriate   ED Course   Medications - No data to display  Orders Placed This Encounter  Procedures   Ear wax removal    Bilateral ears, start with the right    Standing Status:   Standing    Number of Occurrences:   1    No results found for this or any previous visit (from the past 24 hour(s)). No results found.  ED Clinical Impression  1. Bilateral impacted cerumen      ED Assessment/Plan  Patient with cerumen impaction right ear, partial impaction on the left.  Will have ears irrigated and reevaluate.  On reevaluation, bilateral EACs, TMs normal.  No evidence of otitis externa or media.  TMs intact bilaterally.  Hearing improved.  Symptoms have resolved.  Advised Debrox, cleaning ears with finger and thin cloth in the future.  No Q-tips.  Follow-up with PMD as needed    No orders of the defined types were placed in this encounter.     *This clinic note was created using Dragon dictation software. Therefore, there may be occasional mistakes despite careful proofreading.  ?    Domenick Gong, MD 03/04/21 [phone]

## 2021-03-01 NOTE — ED Triage Notes (Signed)
Cleaned ears on Saturday.  Noticed pressure in both ears.

## 2021-06-16 ENCOUNTER — Encounter (HOSPITAL_COMMUNITY): Payer: Self-pay | Admitting: Emergency Medicine

## 2021-06-16 ENCOUNTER — Emergency Department (HOSPITAL_COMMUNITY)
Admission: EM | Admit: 2021-06-16 | Discharge: 2021-06-16 | Disposition: A | Discharge status: Home / Self Care | Payer: 59 | Attending: Student | Admitting: Student

## 2021-06-16 DIAGNOSIS — M542 Cervicalgia: Secondary | ICD-10-CM | POA: Diagnosis present

## 2021-06-16 DIAGNOSIS — Y990 Civilian activity done for income or pay: Secondary | ICD-10-CM | POA: Insufficient documentation

## 2021-06-16 DIAGNOSIS — Y92511 Restaurant or cafe as the place of occurrence of the external cause: Secondary | ICD-10-CM | POA: Insufficient documentation

## 2021-06-16 DIAGNOSIS — J45909 Unspecified asthma, uncomplicated: Secondary | ICD-10-CM | POA: Insufficient documentation

## 2021-06-16 DIAGNOSIS — X503XXA Overexertion from repetitive movements, initial encounter: Secondary | ICD-10-CM | POA: Diagnosis not present

## 2021-06-16 MED ORDER — NAPROXEN 500 MG PO TABS: 500.0000 mg | ORAL_TABLET | Freq: Two times a day (BID) | ORAL | 0 refills | Status: DC

## 2021-06-16 MED ORDER — METHOCARBAMOL 500 MG PO TABS
1000.0000 mg | ORAL_TABLET | Freq: Once | ORAL | Status: AC
  Administered 2021-06-16: 14:00:00 1000 mg via ORAL
  Filled 2021-06-16: qty 2

## 2021-06-16 MED ORDER — METHOCARBAMOL 500 MG PO TABS: 1000.0000 mg | ORAL_TABLET | Freq: Four times a day (QID) | ORAL | 0 refills | Status: DC

## 2021-06-16 NOTE — Discharge Instructions (Signed)
Please read and follow all provided instructions.  Your diagnoses today include:  1. Neck pain    Tests performed today include: Vital signs - see below for your results today  Medications prescribed:  Robaxin (methocarbamol) - muscle relaxer medication  DO NOT drive or perform any activities that require you to be awake and alert because this medicine can make you drowsy.   Naproxen - anti-inflammatory pain medication Do not exceed 500mg  naproxen every 12 hours, take with food  You have been prescribed an anti-inflammatory medication or NSAID. Take with food. Take smallest effective dose for the shortest duration needed for your pain. Stop taking if you experience stomach pain or vomiting.   Take any prescribed medications only as directed.  Home care instructions:  Follow any educational materials contained in this packet Please rest, use ice or heat on your back for the next several days Do not lift, push, pull anything more than 10 pounds for the next week  Follow-up instructions: Please follow-up with your primary care provider in the next 1 week for further evaluation of your symptoms.   Return instructions:  SEEK IMMEDIATE MEDICAL ATTENTION IF YOU HAVE: New numbness, tingling, weakness, or problem with the use of your arms or legs Changes in your vision or loss of vision, especially if it is only in 1 eye. Severe back pain not relieved with medications Loss control of your bowels or bladder Increasing pain in any areas of the body (such as chest or abdominal pain) Shortness of breath, dizziness, or fainting.  Worsening nausea (feeling sick to your stomach), vomiting, fever, or sweats Any other emergent concerns regarding your health   Additional Information:  Your vital signs today were: BP 129/90 (BP Location: Right Arm)   Pulse 93   Temp 98.2 F (36.8 C) (Oral)   Resp 15   SpO2 100%  If your blood pressure (BP) was elevated above 135/85 this visit, please have  this repeated by your doctor within one month. --------------

## 2021-06-16 NOTE — ED Triage Notes (Signed)
Patient complains of sudden onset of sharp left sided neck pain that started today at approximately 1100. Pain is only present when trying to rotate head to left, patient states she does not feel pain when turning head to right or not moving. Patient denies other complaints and is alert, oriented, and in no apparent distress at this time.

## 2021-06-16 NOTE — ED Provider Notes (Signed)
[facility] EMERGENCY DEPARTMENT Provider Note   CSN: [id number] Arrival date & time: 06/16/21  1147     History Chief Complaint  Patient presents with   Neck Pain    Laurie Obrien is a 25 y.o. female.  Patient presents the emergency department for cute onset of left-sided neck pain that started while she was at work.  Patient works at [employer] and works in [employer].  She does a lot of repetitive motions with her neck and upper body.  Pain began acutely.  There is no associated strokelike symptoms such as vision loss, weakness, numbness, or tingling in the extremities.  No difficulty speaking or swallowing.  Pain is worse with movement of her neck.  She denies any recent injuries, accidents, chiropractor manipulations or massage.  No fevers, URI symptoms, or confusion.  No treatments prior to arrival.      Past Medical History:  Diagnosis Date   Asthma     Patient Active Problem List   Diagnosis Date Noted   Asthma     No past surgical history on file.   OB History   No obstetric history on file.     Family History  Problem Relation Age of Onset   Asthma Mother    Hypertension Maternal Grandmother    Healthy Father     Social History   Tobacco Use   Smoking status: Never   Smokeless tobacco: Never  Vaping Use   Vaping Use: Never used  Substance Use Topics   Alcohol use: No   Drug use: No    Home Medications Prior to Admission medications   Medication Sig Start Date End Date Taking? Authorizing Provider  albuterol (VENTOLIN HFA) 108 (90 Base) MCG/ACT inhaler Inhale 2 puffs into the lungs every 4 (four) hours as needed for wheezing or shortness of breath. Dispense with aerochamber 10/30/20   Jaynee Eagles, PA-C  cetirizine (ZYRTEC) 10 MG tablet Take 1 tablet (10 mg total) by mouth daily. 10/30/20   Jaynee Eagles, PA-C  montelukast (SINGULAIR) 10 MG tablet Take 1 tablet (10 mg total) by mouth at bedtime. 10/30/20   Jaynee Eagles, PA-C     Allergies    Patient has no known allergies.  Review of Systems   Review of Systems  Constitutional:  Negative for fever.  HENT:  Negative for rhinorrhea and sore throat.   Eyes:  Negative for redness and visual disturbance.  Respiratory:  Negative for cough.   Cardiovascular:  Negative for chest pain.  Gastrointestinal:  Negative for abdominal pain, diarrhea, nausea and vomiting.  Genitourinary:  Negative for dysuria, frequency, hematuria and urgency.  Musculoskeletal:  Positive for myalgias, neck pain and neck stiffness. Negative for arthralgias and gait problem.  Skin:  Negative for rash.  Neurological:  Negative for headaches.  Psychiatric/Behavioral:  Negative for confusion.    Physical Exam Updated Vital Signs BP 129/90 (BP Location: Right Arm)   Pulse 93   Temp 98.2 F (36.8 C) (Oral)   Resp 15   SpO2 100%   Physical Exam Vitals and nursing note reviewed.  Constitutional:      Appearance: She is well-developed.  HENT:     Head: Normocephalic and atraumatic.  Eyes:     Conjunctiva/sclera: Conjunctivae normal.  Neck:     Thyroid: No thyroid mass.     Vascular: Normal carotid pulses. No carotid bruit.     Trachea: Trachea normal.      Comments: 2+ carotid pulses bilaterally.  No carotid  bruit auscultated.  No lymphadenopathy.  No obvious thyroid enlargement. Pulmonary:     Effort: No respiratory distress.  Musculoskeletal:     Cervical back: Neck supple. No edema or erythema. Pain with movement and muscular tenderness present. No spinous process tenderness. Decreased range of motion.     Comments: Patient with gross 5 out of 5 strength in upper and lower extremities bilaterally, symmetrically.  Lymphadenopathy:     Cervical: No cervical adenopathy.  Skin:    General: Skin is warm and dry.  Neurological:     Mental Status: She is alert.    ED Results / Procedures / Treatments   Labs (all labs ordered are listed, but only abnormal results are  displayed) Labs Reviewed - No data to display  EKG None  Radiology No results found.  Procedures Procedures   Medications Ordered in ED Medications  methocarbamol (ROBAXIN) tablet 1,000 mg (has no administration in time range)    ED Course  I have reviewed the triage vital signs and the nursing notes.  Pertinent labs & imaging results that were available during my care of the patient were reviewed by me and considered in my medical decision making (see chart for details).  Patient seen and examined.  Patient with concerns for musculoskeletal neck pain.  Decreased of range of motion with leftward rotation.  Plan: Robaxin, NSAIDs, heat and rest.  Vital signs reviewed and are as follows: BP 129/90 (BP Location: Right Arm)   Pulse 93   Temp 98.2 F (36.8 C) (Oral)   Resp 15   SpO2 100%   Encouraged return to the emergency department with worsening or any strokelike symptoms.  2:17 PM Patient counseled on proper use of muscle relaxant medication.  They were told not to drink alcohol, drive any vehicle, or do any dangerous activities while taking this medication.  Patient verbalized understanding.    MDM Rules/Calculators/A&P                           Patient with left lateral neck pain.  She does a job where she does repetitive motions with neck and upper body, working in a [employer].  She has normal neurological exam without any concerning neurological features.  No concerns for meningitis or infection.  No skin findings such as shingles or ecchymosis.  We will treat conservatively.  Discussed return findings as above.    Final Clinical Impression(s) / ED Diagnoses Final diagnoses:  Neck pain    Rx / DC Orders ED Discharge Orders          Ordered    methocarbamol (ROBAXIN) 500 MG tablet  4 times daily        06/16/21 1417    naproxen (NAPROSYN) 500 MG tablet  2 times daily        06/16/21 1417             Renne Crigler, PA-C 06/16/21 1419     Kommor, Wyn Forster, MD 06/16/21 1554

## 2021-07-14 ENCOUNTER — Ambulatory Visit (HOSPITAL_COMMUNITY)
Admission: EM | Admit: 2021-07-14 | Discharge: 2021-07-14 | Disposition: A | Discharge status: Home / Self Care | Payer: 59 | Attending: Urgent Care | Admitting: Urgent Care

## 2021-07-14 ENCOUNTER — Encounter (HOSPITAL_COMMUNITY): Payer: Self-pay | Admitting: Emergency Medicine

## 2021-07-14 ENCOUNTER — Other Ambulatory Visit: Payer: Self-pay

## 2021-07-14 DIAGNOSIS — J302 Other seasonal allergic rhinitis: Secondary | ICD-10-CM

## 2021-07-14 DIAGNOSIS — R0981 Nasal congestion: Secondary | ICD-10-CM

## 2021-07-14 DIAGNOSIS — J4520 Mild intermittent asthma, uncomplicated: Secondary | ICD-10-CM | POA: Diagnosis not present

## 2021-07-14 MED ORDER — MONTELUKAST SODIUM 10 MG PO TABS: 10.0000 mg | ORAL_TABLET | Freq: Every day | ORAL | 0 refills | Status: DC

## 2021-07-14 NOTE — Discharge Instructions (Addendum)
Continue taking your zyrtec. Add montelukast at night. Open and use your bottle of Nasacort at home per bottle instructions. Out of work today, can return tomorrow but will need to avoid being near drive thru window Follow up with PCP for any recurrent or worsening symptoms

## 2021-07-14 NOTE — ED Provider Notes (Signed)
[facility]    CSN: [id number] Arrival date & time: 07/14/21  1138      History   Chief Complaint Chief Complaint  Patient presents with   Cough   Nasal Congestion    HPI Laurie Obrien is a 26 y.o. female.   Pleasant 25 year old female presents today for an acute onset of nasal congestion and dry cough.  She reports a known history of allergies and asthma for which she takes daily Zyrtec and as needed albuterol.  She states she works at [employer] and has recently been placed at [employer] window.  She notes one of her biggest risk factors for an asthma exacerbation is cold air.  She denies any shortness of breath or chest congestion at this time, but states she feels she developed worsening URI symptoms if not treated appropriately.  She placed a new job application to [employer] to prevent having to be any other cold air.  As far as her current symptoms, she started with nasal congestion last evening, and a dry cough this morning.  She has not required use of increased albuterol.  She has a bottle of Nasacort at home but has not tried it.  She reports a history of good response to DayQuil and NyQuil in the past, but also has not tried that.  She denies headache, fever, shortness of breath, wheezing, or any additional URI symptoms.  She was requesting a note out of work today to prevent worsening of symptoms.   Cough Associated symptoms: no shortness of breath and no wheezing    Past Medical History:  Diagnosis Date   Asthma     Patient Active Problem List   Diagnosis Date Noted   Asthma     History reviewed. No pertinent surgical history.  OB History   No obstetric history on file.      Home Medications    Prior to Admission medications   Medication Sig Start Date End Date Taking? Authorizing Provider  montelukast (SINGULAIR) 10 MG tablet Take 1 tablet (10 mg total) by mouth at bedtime. 07/14/21 08/13/21 Yes Doreatha Offer L, PA  albuterol (VENTOLIN  HFA) 108 (90 Base) MCG/ACT inhaler Inhale 2 puffs into the lungs every 4 (four) hours as needed for wheezing or shortness of breath. Dispense with aerochamber 10/30/20   Wallis Bamberg, PA-C  cetirizine (ZYRTEC) 10 MG tablet Take 1 tablet (10 mg total) by mouth daily. 10/30/20   Wallis Bamberg, PA-C    Family History Family History  Problem Relation Age of Onset   Asthma Mother    Hypertension Maternal Grandmother    Healthy Father     Social History Social History   Tobacco Use   Smoking status: Never   Smokeless tobacco: Never  Vaping Use   Vaping Use: Never used  Substance Use Topics   Alcohol use: No   Drug use: No     Allergies   Patient has no known allergies.   Review of Systems Review of Systems  HENT:  Positive for congestion.   Respiratory:  Positive for cough. Negative for shortness of breath and wheezing.   All other systems reviewed and are negative.   Physical Exam Triage Vital Signs ED Triage Vitals  Enc Vitals Group     BP 07/14/21 1341 123/84     Pulse Rate 07/14/21 1341 92     Resp 07/14/21 1341 18     Temp 07/14/21 1341 98.3 F (36.8 C)     Temp Source 07/14/21  1341 Oral     SpO2 07/14/21 1341 96 %     Weight --      Height --      Head Circumference --      Peak Flow --      Pain Score 07/14/21 1340 0     Pain Loc --      Pain Edu? --      Excl. in GC? --    No data found.  Updated Vital Signs BP 123/84    Pulse 92    Temp 98.3 F (36.8 C) (Oral)    Resp 18    SpO2 96%   Visual Acuity Right Eye Distance:   Left Eye Distance:   Bilateral Distance:    Right Eye Near:   Left Eye Near:    Bilateral Near:     Physical Exam Vitals and nursing note reviewed.  Constitutional:      General: She is not in acute distress.    Appearance: Normal appearance. She is well-developed and normal weight.  HENT:     Head: Normocephalic and atraumatic.     Right Ear: Tympanic membrane, ear canal and external ear normal. There is no impacted cerumen.      Left Ear: Tympanic membrane, ear canal and external ear normal. There is no impacted cerumen.     Nose: Nose normal. No congestion or rhinorrhea.     Mouth/Throat:     Mouth: Mucous membranes are moist.     Pharynx: Oropharynx is clear. No oropharyngeal exudate or posterior oropharyngeal erythema.  Eyes:     Extraocular Movements: Extraocular movements intact.     Conjunctiva/sclera: Conjunctivae normal.     Pupils: Pupils are equal, round, and reactive to light.  Cardiovascular:     Rate and Rhythm: Normal rate and regular rhythm.     Heart sounds: No murmur heard. Pulmonary:     Effort: Pulmonary effort is normal. No respiratory distress.     Breath sounds: Normal breath sounds.  Abdominal:     Palpations: Abdomen is soft.     Tenderness: There is no abdominal tenderness.  Musculoskeletal:        General: No swelling.     Cervical back: Normal range of motion and neck supple.  Skin:    General: Skin is warm and dry.     Capillary Refill: Capillary refill takes less than 2 seconds.  Neurological:     Mental Status: She is alert.  Psychiatric:        Mood and Affect: Mood normal.     UC Treatments / Results  Labs (all labs ordered are listed, but only abnormal results are displayed) Labs Reviewed - No data to display  EKG   Radiology No results found.  Procedures Procedures (including critical care time)  Medications Ordered in UC Medications - No data to display  Initial Impression / Assessment and Plan / UC Course  I have reviewed the triage vital signs and the nursing notes.  Pertinent labs & imaging results that were available during my care of the patient were reviewed by me and considered in my medical decision making (see chart for details).     Nasal congestion - supportive care. Open and start using your home bottle of Nasacort. Seasonal allergies - nasal steroid and antihistamines. Avoid known triggers. Not written to prevent pt being near cold  windows.  Mild asthma - no exacerbation on exam. Add montelukast to aid in prevention and tx #1 and #  2 above. Take at night.   Final Clinical Impressions(s) / UC Diagnoses   Final diagnoses:  Nasal congestion  Seasonal allergies  Mild intermittent asthma without complication     Discharge Instructions      Continue taking your zyrtec. Add montelukast at night. Open and use your bottle of Nasacort at home per bottle instructions. Out of work today, can return tomorrow but will need to avoid being near drive thru window Follow up with PCP for any recurrent or worsening symptoms     ED Prescriptions     Medication Sig Dispense Auth. Provider   montelukast (SINGULAIR) 10 MG tablet Take 1 tablet (10 mg total) by mouth at bedtime. 30 tablet Camri Molloy L, [place]      PDMP not reviewed this encounter.   Maretta Bees, [place] 07/14/21 1413

## 2021-07-14 NOTE — ED Triage Notes (Signed)
Pt is present today with cough anda nasal congestion. Pt sx started yesterday

## 2021-11-23 ENCOUNTER — Ambulatory Visit (INDEPENDENT_AMBULATORY_CARE_PROVIDER_SITE_OTHER): Payer: 59 | Admitting: Family Medicine

## 2021-11-23 ENCOUNTER — Encounter: Payer: Self-pay | Admitting: Family Medicine

## 2021-11-23 VITALS — BP 122/70 | HR 98 | Temp 98.3°F | Ht 64.0 in | Wt 206.2 lb

## 2021-11-23 DIAGNOSIS — Z114 Encounter for screening for human immunodeficiency virus [HIV]: Secondary | ICD-10-CM

## 2021-11-23 DIAGNOSIS — Z1159 Encounter for screening for other viral diseases: Secondary | ICD-10-CM

## 2021-11-23 DIAGNOSIS — J4520 Mild intermittent asthma, uncomplicated: Secondary | ICD-10-CM

## 2021-11-23 NOTE — Progress Notes (Signed)
? ? ?  SUBJECTIVE:  ? ?CHIEF COMPLAINT / HPI:  ? ?Asthma Allergies ?Well controlled on as needed albuterol.  Last took months ago.  Taking  zyrtec and singulair daily.   ? ?Exercises walking her dog in yard or inside about 15 minutes a day ? ?Wishes to lose weight.   Has plans to alter her diet ? ?Never sexually active - does not wish to have a PAP ? ? 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 ?OBJECTIVE:  ? ?BP 122/70   Pulse 98   Temp 98.3 ?F (36.8 ?C)   Ht 5\' 4"  (1.626 m)   Wt 206 lb 3.2 oz (93.5 kg)   LMP 11/09/2021   SpO2 98%   BMI 35.39 kg/m?   ?Heart - Regular rate and rhythm.  No murmurs, gallops or rubs.    ?Lungs:  Normal respiratory effort, chest expands symmetrically. Lungs are clear to auscultation, no crackles or wheezes. ?Neck:  No deformities, thyromegaly, masses, or tenderness noted.   Supple with full range of motion without pain. ?Abdomen: soft and non-tender without masses, organomegaly or hernias noted.  No guarding or rebound ?Extremities:  No cyanosis, edema, or deformity noted with good range of motion of all major joints.   ?Mobility:able to get up and down from exam table without assistance or distress ? ? ?ASSESSMENT/PLAN:  ? ? ?Annual Examination Female  under 40 yo ? ?I reviewed the following patient responses on our Physical Exam Form ?Tobacco use  ?Alcohol Use  ?Weight  ?Exercise  ?Risk for STI  ?Increased family cancer risk ?Violence risk ? ?PHQ9 score reviewed  ?Blood pressure reviewed ? ?I considered the following items based upon USPSTF recommendations: ?HIV testing:  ?Hepatitis C testing ?Cholesterol screening - did last year ?STI screening if high risk (Hepatitis B, Syphilis, Gonorrhea, Chlamydia) ?Cervical Cancer if female at birth - refuses  ?Immunizations - Influenza, Covid, [place], Pneumonia, Tetanus - suggest in Fall  ? ?See After Visit Summary for recommendations  ? ?08-26-1969, MD ?[facility]  ?

## 2021-11-23 NOTE — Patient Instructions (Signed)
Good to see you today - Thank you for coming in ? ?Things we discussed today: ? ?Pap smear - if you think you would like then make an appointment ? ?Weight  ? Exercise - 30 minutes every day ? Diet - mostly fruits and veggies limit carbohydrates - pasta sweets ? ?If you need refills let me know ? ?I will call you if your tests are not good.  Otherwise, I will send you a message on MyChart (if it is active) or a letter in the mail..  If you do not hear from me with in 2 weeks please call our office.    ? ? ?Please always bring your medication bottles ? ?Come back to see me in 12 months  ?

## 2021-11-23 NOTE — Assessment & Plan Note (Signed)
Intermittent well controlled as are her allergies  ?

## 2021-11-24 LAB — HIV ANTIBODY (ROUTINE TESTING W REFLEX): HIV Screen 4th Generation wRfx: NONREACTIVE

## 2021-11-24 LAB — HEPATITIS C ANTIBODY: Hep C Virus Ab: NONREACTIVE

## 2022-01-03 ENCOUNTER — Encounter: Payer: Self-pay | Admitting: *Deleted

## 2022-11-28 ENCOUNTER — Encounter: Payer: Self-pay | Admitting: Family Medicine

## 2022-11-29 ENCOUNTER — Other Ambulatory Visit: Payer: Self-pay

## 2022-11-29 ENCOUNTER — Ambulatory Visit (INDEPENDENT_AMBULATORY_CARE_PROVIDER_SITE_OTHER): Payer: 59 | Admitting: Family Medicine

## 2022-11-29 ENCOUNTER — Encounter: Payer: Self-pay | Admitting: Family Medicine

## 2022-11-29 VITALS — BP 118/82 | HR 123 | Ht 64.0 in | Wt 218.8 lb

## 2022-11-29 DIAGNOSIS — Z0000 Encounter for general adult medical examination without abnormal findings: Secondary | ICD-10-CM | POA: Diagnosis not present

## 2022-11-29 NOTE — Patient Instructions (Signed)
Good to see you today - Thank you for coming in  Things we discussed today:   You need to Improve your Diet Drink water and other unsweetened drinks like coffee, milk, tea- Try to slowly decrease sweet drinks Eat smaller portions of starchy and sweet foods like rice, potatoes, wheat, corn, or fruits.  Eat twice as many vegetables  Exercise at least 30 minutes every day If you wish to discuss weight loss further, please let me know or make an appointment

## 2022-11-29 NOTE — Progress Notes (Unsigned)
    SUBJECTIVE:   CHIEF COMPLAINT / HPI:   HPV vaccine    Asthma Allergies Well controlled on as needed albuterol.  Last took months ago.  Taking  zyrtec and singulair daily.     Exercises walking her dog in yard or inside about 15 minutes a day   Wishes to lose weight.   Has plans to alter her diet   Never sexually active - does not wish to have a PAP   PERTINENT  PMH / PSH: *** Patient Active Problem List   Diagnosis Date Noted   Asthma     Current Outpatient Medications  Medication Instructions   albuterol (VENTOLIN HFA) 108 (90 Base) MCG/ACT inhaler 2 puffs, Inhalation, Every 4 hours PRN, Dispense with aerochamber   cetirizine (ZYRTEC) 10 mg, Oral, Daily   montelukast (SINGULAIR) 10 mg, Oral, Daily at bedtime       11/23/2021    9:37 AM 07/14/2021    1:41 PM 06/16/2021    2:25 PM  Vitals with BMI  Height 5\' 4"     Weight 206 lbs 3 oz    BMI 35.38    Systolic 122 123 045  Diastolic 70 84 83  Pulse 98 92 86      OBJECTIVE:   There were no vitals taken for this visit.  ***  ASSESSMENT/PLAN:   There are no diagnoses linked to this encounter.   There are no Patient Instructions on file for this visit.   Carney Living, MD [facility]

## 2024-01-02 ENCOUNTER — Ambulatory Visit: Admitting: Family Medicine

## 2024-01-02 ENCOUNTER — Encounter: Payer: Self-pay | Admitting: Family Medicine

## 2024-01-02 VITALS — BP 110/70 | HR 100 | Ht 64.0 in | Wt 214.6 lb

## 2024-01-02 DIAGNOSIS — Z0000 Encounter for general adult medical examination without abnormal findings: Secondary | ICD-10-CM | POA: Diagnosis not present

## 2024-01-02 NOTE — Patient Instructions (Signed)
 Great to see you! I will send you a note in MyChart when I get your blood work back.

## 2024-01-03 DIAGNOSIS — Z0000 Encounter for general adult medical examination without abnormal findings: Secondary | ICD-10-CM | POA: Insufficient documentation

## 2024-01-03 LAB — BASIC METABOLIC PANEL WITH GFR
BUN/Creatinine Ratio: 14 (ref 9–23)
BUN: 10 mg/dL (ref 6–20)
CO2: 21 mmol/L (ref 20–29)
Calcium: 9.6 mg/dL (ref 8.7–10.2)
Chloride: 101 mmol/L (ref 96–106)
Creatinine, Ser: 0.71 mg/dL (ref 0.57–1.00)
Glucose: 87 mg/dL (ref 70–99)
Potassium: 4.5 mmol/L (ref 3.5–5.2)
Sodium: 140 mmol/L (ref 134–144)
eGFR: 120 mL/min/{1.73_m2} (ref >59)

## 2024-01-03 LAB — VITAMIN D 25 HYDROXY (VIT D DEFICIENCY, FRACTURES): Vit D, 25-Hydroxy: 32.3 ng/mL (ref 30.0–100.0)

## 2024-01-03 NOTE — Progress Notes (Signed)
    CHIEF COMPLAINT / HPI: Here for checkup, meet her new physician.  Currently has no complaints.  Never sexually active at all.  Does not smoke or vape, no illicits.  Currently in college courses.   PERTINENT  PMH / PSH: I have reviewed the patient's medications, allergies, past medical and surgical history, smoking status and updated in the EMR as appropriate.   OBJECTIVE:  BP 110/70 (Cuff Size: Small)   Pulse 100   Ht 5\' 4"  (1.626 m)   Wt 214 lb 9.6 oz (97.3 kg)   SpO2 100%   BMI 36.84 kg/m  Vital signs reviewed. GENERAL: Well-developed, well-nourished, no acute distress. CARDIOVASCULAR: Regular rate and rhythm no murmur gallop or rub LUNGS: Clear to auscultation bilaterally, no rales or wheeze. ABDOMEN: Soft positive bowel sounds NEURO: No gross focal neurological deficits. MSK: Movement of extremity x 4. PSYCH: AxOx4. Good eye contact.. No psychomotor retardation or agitation. Appropriate speech fluency and content. Asks and answers questions appropriately. Mood is congruent.   ASSESSMENT / PLAN:   Well adult health check Reviewed diet which is pretty reasonable.  She is not getting much exercise and we reviewed that and options for that.  Health maintenance is updated.  She does not need a Pap smear as she is never been sexually active.  Does have history of low vitamin D  levels we will recheck that.  From a bone density standpoint, it might be beneficial to supplement her if she still low.  Will also check a basic metabolic panel as she has family history of diabetes.   Violetta Grice MD

## 2024-01-03 NOTE — Assessment & Plan Note (Signed)
 Reviewed diet which is pretty reasonable.  She is not getting much exercise and we reviewed that and options for that.  Health maintenance is updated.  She does not need a Pap smear as she is never been sexually active.  Does have history of low vitamin D  levels we will recheck that.  From a bone density standpoint, it might be beneficial to supplement her if she still low.  Will also check a basic metabolic panel as she has family history of diabetes.

## 2024-01-07 ENCOUNTER — Ambulatory Visit: Payer: Self-pay | Admitting: Family Medicine

## 2024-01-07 NOTE — Progress Notes (Signed)
 Your vitamin D  level was in the normal range.  Great news.

## 2024-02-20 ENCOUNTER — Ambulatory Visit: Admitting: Family Medicine

## 2024-02-20 ENCOUNTER — Encounter: Payer: Self-pay | Admitting: Family Medicine

## 2024-02-20 VITALS — BP 112/80 | HR 100 | Temp 97.6°F | Wt 217.2 lb

## 2024-02-20 DIAGNOSIS — J208 Acute bronchitis due to other specified organisms: Secondary | ICD-10-CM | POA: Diagnosis not present

## 2024-02-20 NOTE — Patient Instructions (Addendum)
 It was great to see you today! Thank you for choosing [facility] for your primary care. Laurie Obrien was seen for upper respiratory illness.  Today we addressed: Viral Bronchitis - take Mucinex 1200 mg twice daily or try Robitussin. Continue conservative measures with hot teas, allergy medications, and albuterol  inhaler as needed. Viral infections take some time to resolve If symptoms worsen please don't hesitate to follow up  You should return to our clinic Return if symptoms worsen or fail to improve. Please arrive 15 minutes before your appointment to ensure smooth check in process.  We appreciate your efforts in making this happen.  Thank you for allowing me to participate in your care, Kathrine Melena, DO 02/20/2024, 9:53 AM PGY-2, [facility]

## 2024-02-20 NOTE — Progress Notes (Signed)
    SUBJECTIVE:   CHIEF COMPLAINT / HPI:   URI - Coughing up mucus and runny nose since last week. No fevers or chills. - Cough improved over the week, but thickened mucus still persists and worse over the last two nights - Mucinex for the last day, prior to that was taking NyQuil and DayQuil with honey - Drinking hot teas with honey, soup - No sick contacts - Patient is an asthmatic and takes Zyrtec  for allergies. Using her inhaler 2x/day, which is more than normal - Used warm steam vaporizer  PERTINENT  PMH / PSH: asthma  OBJECTIVE:   BP 112/80   Pulse 100   Temp 97.6 F (36.4 C)   Wt 217 lb 3.2 oz (98.5 kg)   LMP 02/20/2024 (Exact Date)   SpO2 98%   BMI 37.28 kg/m   General: Awake and Alert in NAD HEENT: NCAT. Sclera anicteric. No rhinorrhea. Cardiovascular: RRR. No M/R/G Respiratory: CTAB, normal WOB on RA. No wheezing, crackles, rhonchi, or diminished breath sounds. Abdomen: Soft, non-tender, non-distended. Bowel sounds normoactive Extremities: Able to move all extremities. No BLE edema, no deformities or significant joint findings. Skin: Warm and dry. No abrasions or rashes noted. Neuro: No focal neurological deficits.  ASSESSMENT/PLAN:   Assessment & Plan Viral bronchitis Cough rhinorrhea, congestion, and mucus production likely related to viral bronchitis. - Discussed that viral symptoms take a while to run its course - Applauded her for the remedies she has tried already and encouraged her to continue these - Discussed taking Mucinex 1200 mg twice daily or trying Robitussin to help with the mucus production - RTC if symptoms worsen or fail to improve   Kathrine Melena, DO [facility]

## 2024-05-02 ENCOUNTER — Ambulatory Visit

## 2024-05-02 DIAGNOSIS — Z23 Encounter for immunization: Secondary | ICD-10-CM | POA: Diagnosis not present

## 2024-05-02 NOTE — Progress Notes (Signed)
 Patient presents to nurse clinic for flu vaccination. Administered in LD, site unremarkable, tolerated injection well.   Veronda Prude, RN
